# Patient Record
Sex: Female | Born: 1960 | Race: Black or African American | Hispanic: No | Marital: Married | Smoking: Never smoker
Health system: Southern US, Community
[De-identification: ages and names within clinical notes are randomized; demographics above are authoritative.]

## PROBLEM LIST (undated history)

## (undated) ENCOUNTER — Emergency Department (HOSPITAL_COMMUNITY): Discharge status: Against Medical Advice | Payer: BC Managed Care – PPO

## (undated) DIAGNOSIS — E119 Type 2 diabetes mellitus without complications: Secondary | ICD-10-CM

## (undated) DIAGNOSIS — I10 Essential (primary) hypertension: Secondary | ICD-10-CM

## (undated) HISTORY — PX: ABDOMINAL HYSTERECTOMY: SHX81

---

## 2013-06-06 ENCOUNTER — Other Ambulatory Visit (HOSPITAL_COMMUNITY): Payer: Self-pay | Admitting: "Endocrinology

## 2013-06-06 DIAGNOSIS — E0590 Thyrotoxicosis, unspecified without thyrotoxic crisis or storm: Secondary | ICD-10-CM

## 2013-06-14 ENCOUNTER — Encounter (HOSPITAL_COMMUNITY): Payer: Self-pay

## 2013-06-14 ENCOUNTER — Encounter (HOSPITAL_COMMUNITY)
Admission: RE | Admit: 2013-06-14 | Discharge: 2013-06-14 | Disposition: A | Discharge status: Home / Self Care | Payer: BC Managed Care – PPO | Source: Ambulatory Visit | Attending: "Endocrinology | Admitting: "Endocrinology

## 2013-06-14 DIAGNOSIS — E0590 Thyrotoxicosis, unspecified without thyrotoxic crisis or storm: Secondary | ICD-10-CM | POA: Insufficient documentation

## 2013-06-14 HISTORY — DX: Type 2 diabetes mellitus without complications: E11.9

## 2013-06-14 HISTORY — DX: Essential (primary) hypertension: I10

## 2013-06-14 MED ORDER — SODIUM IODIDE I 131 CAPSULE
14.0000 | Freq: Once | INTRAVENOUS | Status: AC | PRN
  Administered 2013-06-14: 14 via ORAL

## 2013-06-15 ENCOUNTER — Encounter (HOSPITAL_COMMUNITY)
Admission: RE | Admit: 2013-06-15 | Discharge: 2013-06-15 | Disposition: A | Discharge status: Home / Self Care | Payer: BC Managed Care – PPO | Source: Ambulatory Visit | Attending: "Endocrinology | Admitting: "Endocrinology

## 2013-06-15 MED ORDER — SODIUM PERTECHNETATE TC 99M INJECTION
10.0000 | Freq: Once | INTRAVENOUS | Status: AC | PRN
  Administered 2013-06-15: 10 via INTRAVENOUS

## 2013-07-09 ENCOUNTER — Other Ambulatory Visit (HOSPITAL_COMMUNITY): Payer: Self-pay | Admitting: "Endocrinology

## 2013-07-09 DIAGNOSIS — E0590 Thyrotoxicosis, unspecified without thyrotoxic crisis or storm: Secondary | ICD-10-CM

## 2013-07-13 ENCOUNTER — Encounter (HOSPITAL_COMMUNITY): Payer: BC Managed Care – PPO

## 2013-07-19 ENCOUNTER — Encounter (HOSPITAL_COMMUNITY): Payer: Self-pay

## 2013-07-19 ENCOUNTER — Encounter (HOSPITAL_COMMUNITY)
Admission: RE | Admit: 2013-07-19 | Discharge: 2013-07-19 | Disposition: A | Discharge status: Home / Self Care | Payer: BC Managed Care – PPO | Source: Ambulatory Visit | Attending: "Endocrinology | Admitting: "Endocrinology

## 2013-07-19 DIAGNOSIS — E0590 Thyrotoxicosis, unspecified without thyrotoxic crisis or storm: Secondary | ICD-10-CM

## 2013-07-19 MED ORDER — SODIUM IODIDE I 131 CAPSULE
15.0000 | Freq: Once | INTRAVENOUS | Status: AC | PRN
  Administered 2013-07-19: 15 via ORAL

## 2013-12-13 ENCOUNTER — Ambulatory Visit (INDEPENDENT_AMBULATORY_CARE_PROVIDER_SITE_OTHER): Payer: BC Managed Care – PPO | Admitting: Otolaryngology

## 2013-12-13 DIAGNOSIS — R490 Dysphonia: Secondary | ICD-10-CM

## 2013-12-13 DIAGNOSIS — R221 Localized swelling, mass and lump, neck: Secondary | ICD-10-CM

## 2013-12-17 ENCOUNTER — Other Ambulatory Visit (INDEPENDENT_AMBULATORY_CARE_PROVIDER_SITE_OTHER): Payer: Self-pay | Admitting: Otolaryngology

## 2013-12-17 DIAGNOSIS — R221 Localized swelling, mass and lump, neck: Secondary | ICD-10-CM

## 2013-12-19 ENCOUNTER — Ambulatory Visit (HOSPITAL_COMMUNITY)
Admission: RE | Admit: 2013-12-19 | Discharge: 2013-12-19 | Disposition: A | Discharge status: Home / Self Care | Payer: BC Managed Care – PPO | Source: Ambulatory Visit | Attending: Otolaryngology | Admitting: Otolaryngology

## 2013-12-19 DIAGNOSIS — R110 Nausea: Secondary | ICD-10-CM | POA: Diagnosis not present

## 2013-12-19 DIAGNOSIS — R938 Abnormal findings on diagnostic imaging of other specified body structures: Secondary | ICD-10-CM | POA: Diagnosis not present

## 2013-12-19 DIAGNOSIS — R221 Localized swelling, mass and lump, neck: Secondary | ICD-10-CM | POA: Insufficient documentation

## 2013-12-19 LAB — POCT I-STAT CREATININE: Creatinine, Ser: 0.7 mg/dL (ref 0.50–1.10)

## 2013-12-19 MED ORDER — IOHEXOL 300 MG/ML  SOLN
75.0000 mL | Freq: Once | INTRAMUSCULAR | Status: AC | PRN
  Administered 2013-12-19: 80 mL via INTRAVENOUS

## 2014-01-10 ENCOUNTER — Ambulatory Visit (INDEPENDENT_AMBULATORY_CARE_PROVIDER_SITE_OTHER): Payer: BC Managed Care – PPO | Admitting: Otolaryngology

## 2014-01-24 ENCOUNTER — Ambulatory Visit (INDEPENDENT_AMBULATORY_CARE_PROVIDER_SITE_OTHER): Payer: BC Managed Care – PPO | Admitting: Otolaryngology

## 2014-02-14 ENCOUNTER — Other Ambulatory Visit (INDEPENDENT_AMBULATORY_CARE_PROVIDER_SITE_OTHER): Payer: Self-pay | Admitting: Otolaryngology

## 2014-02-14 DIAGNOSIS — R221 Localized swelling, mass and lump, neck: Secondary | ICD-10-CM

## 2014-02-21 ENCOUNTER — Other Ambulatory Visit (INDEPENDENT_AMBULATORY_CARE_PROVIDER_SITE_OTHER): Payer: Self-pay | Admitting: Otolaryngology

## 2014-02-21 ENCOUNTER — Ambulatory Visit (HOSPITAL_COMMUNITY)
Admission: RE | Admit: 2014-02-21 | Discharge: 2014-02-21 | Disposition: A | Discharge status: Home / Self Care | Payer: BLUE CROSS/BLUE SHIELD | Source: Ambulatory Visit | Attending: Otolaryngology | Admitting: Otolaryngology

## 2014-02-21 ENCOUNTER — Encounter (HOSPITAL_COMMUNITY): Payer: Self-pay

## 2014-02-21 DIAGNOSIS — R221 Localized swelling, mass and lump, neck: Secondary | ICD-10-CM

## 2014-02-21 DIAGNOSIS — E041 Nontoxic single thyroid nodule: Secondary | ICD-10-CM | POA: Diagnosis present

## 2014-02-21 MED ORDER — LIDOCAINE HCL (PF) 2 % IJ SOLN
INTRAMUSCULAR | Status: AC
  Filled 2014-02-21: qty 10

## 2014-02-21 MED ORDER — LIDOCAINE HCL (PF) 2 % IJ SOLN
10.0000 mL | Freq: Once | INTRAMUSCULAR | Status: AC
  Administered 2014-02-21: 10 mL

## 2014-02-21 NOTE — Discharge Instructions (Signed)
Thyroid Biopsy °The thyroid gland is a butterfly-shaped gland situated in the front of the neck. It produces hormones which affect metabolism, growth and development, and body temperature. A thyroid biopsy is a procedure in which small samples of tissue or fluid are removed from the thyroid gland or mass and examined under a microscope. This test is done to determine the cause of thyroid problems, such as infection, cancer, or other thyroid problems. °There are 2 ways to obtain samples: °1. Fine needle biopsy. Samples are removed using a thin needle inserted through the skin and into the thyroid gland or mass. °2. Open biopsy. Samples are removed after a cut (incision) is made through the skin. °LET YOUR CAREGIVER KNOW ABOUT:  °· Allergies. °· Medications taken including herbs, eye drops, over-the-counter medications, and creams. °· Use of steroids (by mouth or creams). °· Previous problems with anesthetics or numbing medicine. °· Possibility of pregnancy, if this applies. °· History of blood clots (thrombophlebitis). °· History of bleeding or blood problems. °· Previous surgery. °· Other health problems. °RISKS AND COMPLICATIONS °· Bleeding from the site. The risk of bleeding is higher if you have a bleeding disorder or are taking any blood thinning medications (anticoagulants). °· Infection. °· Injury to structures near the thyroid gland. °BEFORE THE PROCEDURE  °This is a procedure that can be done as an outpatient. Confirm the time that you need to arrive for your procedure. Confirm whether there is a need to fast or withhold any medications. A blood sample may be done to determine your blood clotting time. Medicine may be given to help you relax (sedative). °PROCEDURE °Fine needle biopsy. °You will be awake during the procedure. You may be asked to lie on your back with your head tipped backward to extend your neck. Let your caregiver know if you cannot tolerate the positioning. An area on your neck will be  cleansed. A needle is inserted through the skin of your neck. You may feel a mild discomfort during this procedure. You may be asked to avoid coughing, talking, swallowing, or making sounds during some portions of the procedure. The needle is withdrawn once tissue or fluid samples have been removed. Pressure may be applied to the neck to reduce swelling and ensure that bleeding has stopped. The samples will be sent for examination.  °Open biopsy. °You will be given general anesthesia. You will be asleep during the procedure. An incision is made in your neck. A sample of thyroid tissue or the mass is removed. The tissue sample or mass will be sent for examination. The sample or mass may be examined during the biopsy. If the sample or mass contains cancer cells, some or all of the thyroid gland may be removed. The incision is closed with stitches. °AFTER THE PROCEDURE  °Your recovery will be assessed and monitored. If there are no problems, as an outpatient, you should be able to go home shortly after the procedure. °If you had a fine needle biopsy: °· You may have soreness at the biopsy site for 1 to 2 days. °If you had an open biopsy:  °· You may have soreness at the biopsy site for 3 to 4 days. °· You may have a hoarse voice or sore throat for 1 to 2 days. °Obtaining the Test Results °It is your responsibility to obtain your test results. Do not assume everything is normal if you have not heard from your caregiver or the medical facility. It is important for you to follow up   on all of your test results. °HOME CARE INSTRUCTIONS  °· Keeping your head raised on a pillow when you are lying down may ease biopsy site discomfort. °· Supporting the back of your head and neck with both hands as you sit up from a lying position may ease biopsy site discomfort. °· Only take over-the-counter or prescription medicines for pain, discomfort, or fever as directed by your caregiver. °· Throat lozenges or gargling with warm salt  water may help to soothe a sore throat. °SEEK IMMEDIATE MEDICAL CARE IF:  °· You have severe bleeding from the biopsy site. °· You have difficulty swallowing. °· You have a fever. °· You have increased pain, swelling, redness, or warmth at the biopsy site. °· You notice pus coming from the biopsy site. °· You have swollen glands (lymph nodes) in your neck. °Document Released: 11/22/2006 Document Revised: 05/22/2012 Document Reviewed: 04/19/2013 °Exit[employer]® Patient Information ©2015 [employer]. This information is not intended to replace advice given to you by your health care provider. Make sure you discuss any questions you have with your health care provider. ° °

## 2014-03-07 ENCOUNTER — Ambulatory Visit (INDEPENDENT_AMBULATORY_CARE_PROVIDER_SITE_OTHER): Payer: BLUE CROSS/BLUE SHIELD | Admitting: Otolaryngology

## 2014-03-07 ENCOUNTER — Encounter: Payer: BLUE CROSS/BLUE SHIELD | Attending: "Endocrinology | Admitting: Nutrition

## 2014-03-07 ENCOUNTER — Encounter: Payer: Self-pay | Admitting: Nutrition

## 2014-03-07 VITALS — Ht <= 58 in | Wt 108.6 lb

## 2014-03-07 DIAGNOSIS — R636 Underweight: Secondary | ICD-10-CM

## 2014-03-07 DIAGNOSIS — E108 Type 1 diabetes mellitus with unspecified complications: Principal | ICD-10-CM

## 2014-03-07 DIAGNOSIS — IMO0002 Reserved for concepts with insufficient information to code with codable children: Secondary | ICD-10-CM

## 2014-03-07 DIAGNOSIS — R1312 Dysphagia, oropharyngeal phase: Secondary | ICD-10-CM

## 2014-03-07 DIAGNOSIS — D440 Neoplasm of uncertain behavior of thyroid gland: Secondary | ICD-10-CM

## 2014-03-07 DIAGNOSIS — E1065 Type 1 diabetes mellitus with hyperglycemia: Secondary | ICD-10-CM

## 2014-03-07 NOTE — Progress Notes (Signed)
  Medical Nutrition Therapy:  Appt start time: 8127 end time:  1630.   Assessment:  Primary concerns today: Diabetes. LIve with her husband daughter and granddaugther. FBS:79  She doesn't know why her blood sugars are up and down. She thinks it has nothing to do with what she is eating. States she takes her medication as prescribed but doesn't always eat three meals per day or balanced meals. Does admit to sometimes forgetting shot at lunch. Most recent A1C was  11.8%. 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   On  lantus 20 units and 5 units of Novolog with meals.   Preferred Learning Style:     No preference indicated   Learning Readiness:     Change in progress   MEDICATIONS: see list   DIETARY INTAKE:   24-hr recall:  B ( AM): 1 slice cake, coffee and popcorn,  Snk ( AM): 1 Pepsi L ( PM): havent eaten lunch today:6" sub Snk ( PM): none D ( PM): 2 slices of pizza, poweade, coffee Snk ( PM): strawberries, water Beverages: water  Usual physical activity: ADL's  Estimated energy needs: 1600 calories 180 g carbohydrates 120 g protein 44 g fat  Progress Towards Goal(s):  In progress.   Nutritional Diagnosis:  NB-1.1 Food and nutrition-related knowledge deficit As related to Diabetes.  As evidenced by A1C 11.8%..    Intervention:  Nutrition counseling on diabetes, insulin, target ranges for blood sugars, treatment of hypo/hyperglycemia, complications, meal planning, CHO counting and importance of medication compliance..  Goals:  Follow Diabetes Meal Plan as instructed  Eat 3 meals per day.  Do not skip meals.  Cut out sweets, cakes, cookies, junk food and sodas  Avoid snacks between meals unless a low blood sugar  Be sure to take Novolog as prescribed before meals and use the sliding scale correctly.  Limit carbohydrate intake to 45 grams carbohydrate/meal  Add lean protein foods to meals/snacks  Monitor glucose levels as instructed by your  doctor  Aim for 30 mins of physical activity daily  Bring food record and glucose log to your next nutrition visit  Goal: 1. Get A1C down to 9% in three months. 2. Take medications as prescribed.   Teaching Method Utilized:  Visual Auditory Hands on  Handouts given during visit include: The Plate Method Carb Counting and Food Label handouts Meal Plan Card  Barriers to learning/adherence to lifestyle change: none  Demonstrated degree of understanding via:  Teach Back   Monitoring/Evaluation:  Dietary intake, exercise, meal planning, SBG, and body weight in 1 month(s).

## 2014-03-08 DIAGNOSIS — E1065 Type 1 diabetes mellitus with hyperglycemia: Secondary | ICD-10-CM | POA: Insufficient documentation

## 2014-03-08 DIAGNOSIS — E108 Type 1 diabetes mellitus with unspecified complications: Principal | ICD-10-CM

## 2014-03-08 DIAGNOSIS — IMO0002 Reserved for concepts with insufficient information to code with codable children: Secondary | ICD-10-CM | POA: Insufficient documentation

## 2014-03-08 NOTE — Patient Instructions (Signed)
Goals:  Follow Diabetes Meal Plan as instructed  Eat 3 meals per day.  Do not skip meals.  Cut out sweets, cakes, cookies, junk food and sodas  Avoid snacks between meals unless a low blood sugar  Be sure to take Novolog as prescribed before meals and use the sliding scale correctly.  Limit carbohydrate intake to 45 grams carbohydrate/meal  Add lean protein foods to meals/snacks  Monitor glucose levels as instructed by your doctor  Aim for 30 mins of physical activity daily  Bring food record and glucose log to your next nutrition visit  Goal: 1. Get A1C down to 9% in three months. 2. Take medications as prescribed.

## 2014-04-17 ENCOUNTER — Encounter: Payer: BLUE CROSS/BLUE SHIELD | Attending: "Endocrinology | Admitting: Nutrition

## 2014-12-09 ENCOUNTER — Ambulatory Visit: Payer: Self-pay | Admitting: "Endocrinology

## 2015-02-12 ENCOUNTER — Other Ambulatory Visit (INDEPENDENT_AMBULATORY_CARE_PROVIDER_SITE_OTHER): Payer: Self-pay | Admitting: Otolaryngology

## 2015-02-12 DIAGNOSIS — E041 Nontoxic single thyroid nodule: Secondary | ICD-10-CM

## 2015-02-21 ENCOUNTER — Ambulatory Visit (HOSPITAL_COMMUNITY)
Admission: RE | Admit: 2015-02-21 | Discharge: 2015-02-21 | Disposition: A | Discharge status: Home / Self Care | Payer: BLUE CROSS/BLUE SHIELD | Source: Ambulatory Visit | Attending: Otolaryngology | Admitting: Otolaryngology

## 2015-02-21 ENCOUNTER — Other Ambulatory Visit (HOSPITAL_COMMUNITY): Payer: BLUE CROSS/BLUE SHIELD

## 2015-02-21 DIAGNOSIS — E041 Nontoxic single thyroid nodule: Secondary | ICD-10-CM | POA: Diagnosis present

## 2015-02-21 DIAGNOSIS — E042 Nontoxic multinodular goiter: Secondary | ICD-10-CM | POA: Diagnosis not present

## 2015-02-27 ENCOUNTER — Ambulatory Visit (INDEPENDENT_AMBULATORY_CARE_PROVIDER_SITE_OTHER): Payer: BLUE CROSS/BLUE SHIELD | Admitting: Otolaryngology

## 2015-02-27 DIAGNOSIS — D440 Neoplasm of uncertain behavior of thyroid gland: Secondary | ICD-10-CM

## 2015-02-27 DIAGNOSIS — R1312 Dysphagia, oropharyngeal phase: Secondary | ICD-10-CM

## 2015-04-07 ENCOUNTER — Other Ambulatory Visit: Payer: Self-pay | Admitting: Otolaryngology

## 2015-04-21 ENCOUNTER — Other Ambulatory Visit: Payer: Self-pay | Admitting: "Endocrinology

## 2015-05-07 ENCOUNTER — Other Ambulatory Visit: Payer: Self-pay | Admitting: "Endocrinology

## 2015-05-27 ENCOUNTER — Encounter (HOSPITAL_COMMUNITY)
Admission: RE | Admit: 2015-05-27 | Discharge: 2015-05-27 | Disposition: A | Discharge status: Home / Self Care | Payer: BLUE CROSS/BLUE SHIELD | Source: Ambulatory Visit | Attending: Otolaryngology | Admitting: Otolaryngology

## 2015-05-27 DIAGNOSIS — E119 Type 2 diabetes mellitus without complications: Secondary | ICD-10-CM | POA: Diagnosis not present

## 2015-05-27 DIAGNOSIS — Z01812 Encounter for preprocedural laboratory examination: Secondary | ICD-10-CM | POA: Insufficient documentation

## 2015-05-27 DIAGNOSIS — Z01818 Encounter for other preprocedural examination: Secondary | ICD-10-CM | POA: Diagnosis not present

## 2015-05-27 DIAGNOSIS — Z7982 Long term (current) use of aspirin: Secondary | ICD-10-CM | POA: Diagnosis not present

## 2015-05-27 DIAGNOSIS — I10 Essential (primary) hypertension: Secondary | ICD-10-CM | POA: Insufficient documentation

## 2015-05-27 DIAGNOSIS — Z794 Long term (current) use of insulin: Secondary | ICD-10-CM | POA: Insufficient documentation

## 2015-05-27 DIAGNOSIS — Z79899 Other long term (current) drug therapy: Secondary | ICD-10-CM | POA: Insufficient documentation

## 2015-05-27 LAB — CBC
HEMATOCRIT: 35.2 % — AB (ref 36.0–46.0)
Hemoglobin: 11.2 g/dL — ABNORMAL LOW (ref 12.0–15.0)
MCH: 24.9 pg — ABNORMAL LOW (ref 26.0–34.0)
MCHC: 31.8 g/dL (ref 30.0–36.0)
MCV: 78.4 fL (ref 78.0–100.0)
Platelets: 282 10*3/uL (ref 150–400)
RBC: 4.49 MIL/uL (ref 3.87–5.11)
RDW: 13.4 % (ref 11.5–15.5)
WBC: 7.6 10*3/uL (ref 4.0–10.5)

## 2015-05-27 LAB — BASIC METABOLIC PANEL
ANION GAP: 11 (ref 5–15)
BUN: 12 mg/dL (ref 6–20)
CHLORIDE: 105 mmol/L (ref 101–111)
CO2: 28 mmol/L (ref 22–32)
Calcium: 10.1 mg/dL (ref 8.9–10.3)
Creatinine, Ser: 0.99 mg/dL (ref 0.44–1.00)
GFR calc non Af Amer: >60 mL/min (ref >60)
Glucose, Bld: 272 mg/dL — ABNORMAL HIGH (ref 65–99)
POTASSIUM: 3.8 mmol/L (ref 3.5–5.1)
Sodium: 144 mmol/L (ref 135–145)

## 2015-05-27 LAB — GLUCOSE, CAPILLARY: GLUCOSE-CAPILLARY: 223 mg/dL — AB (ref 65–99)

## 2015-05-27 NOTE — Pre-Procedure Instructions (Signed)
Rebecca Chan  05/27/2015      [id number] [place] - [id number] [place] [address] Phone: [phone] Fax: [phone]    Your procedure is scheduled on June 04, 2015.  Report to [facility] Admitting at 6:30 A.M.  Call this number if you have problems the morning of surgery:  [phone]   Remember:  Do not eat food or drink liquids after midnight.  Take these medicines the morning of surgery with A SIP OF WATER : NONE  STOP ASPRIN, HERBAL MEDICATIONS, NSAID'S(ALEVE, ADVIL, IBUPROFEN) ONE WEEK PRIOR TO SURGERY   How to Manage Your Diabetes Before and After Surgery  Why is it important to control my blood sugar before and after surgery? . Improving blood sugar levels before and after surgery helps healing and can limit problems. . A way of improving blood sugar control is eating a healthy diet by: o  Eating less sugar and carbohydrates o  Increasing activity/exercise o  Talking with your doctor about reaching your blood sugar goals . High blood sugars (greater than 180 mg/dL) can raise your risk of infections and slow your recovery, so you will need to focus on controlling your diabetes during the weeks before surgery. . Make sure that the doctor who takes care of your diabetes knows about your planned surgery including the date and location.  How do I manage my blood sugar before surgery? . Check your blood sugar at least 4 times a day, starting 2 days before surgery, to make sure that the level is not too high or low. o Check your blood sugar the morning of your surgery when you wake up and every 2 hours until you get to the Short Stay unit. . If your blood sugar is less than 70 mg/dL, you will need to treat for low blood sugar: o Do not take insulin. o Treat a low blood sugar (less than 70 mg/dL) with  cup of clear juice (cranberry or apple), 4 glucose tablets, OR glucose  gel. o Recheck blood sugar in 15 minutes after treatment (to make sure it is greater than 70 mg/dL). If your blood sugar is not greater than 70 mg/dL on recheck, call [phone] for further instructions. . Report your blood sugar to the short stay nurse when you get to Short Stay.  . If you are admitted to the hospital after surgery: o Your blood sugar will be checked by the staff and you will probably be given insulin after surgery (instead of oral diabetes medicines) to make sure you have good blood sugar levels. o The goal for blood sugar control after surgery is 80-180 mg/dL.       WHAT DO I DO ABOUT MY DIABETES MEDICATION?   Marland Kitchen Do not take oral diabetes medicines (pills) the morning of surgery.  . THE NIGHT BEFORE SURGERY, take ___15___ units of ___LANTUS____insulin.      . The day of surgery, do not take other diabetes injectables, including Byetta (exenatide), Bydureon (exenatide ER), Victoza (liraglutide), or Trulicity (dulaglutide).  . If your CBG is greater than 220 mg/dL the morning of your surgery, you may take  of your HUMALOG dose of insulin.    Do not wear jewelry, make-up or nail polish.  Do not wear lotions, powders, or perfumes.  You may wear deodorant.  Do not shave 48 hours prior to surgery.  Men may shave face and neck.  Do not bring valuables to the hospital.  [facility] is not responsible for any belongings or valuables.  Contacts, dentures or bridgework may not be worn into surgery.  Leave your suitcase in the car.  After surgery it may be brought to your room.  For patients admitted to the hospital, discharge time will be determined by your treatment team.  Patients discharged the day of surgery will not be allowed to drive home.   Name and phone number of your driver:    Special instructions:  "preparing for surgery"  Please read over the following fact sheets that you were given. Pain Booklet, Coughing and Deep Breathing and Surgical Site  Infection Prevention

## 2015-05-28 ENCOUNTER — Encounter (HOSPITAL_COMMUNITY): Payer: Self-pay | Admitting: Emergency Medicine

## 2015-05-28 LAB — HEMOGLOBIN A1C
Hgb A1c MFr Bld: 12.8 % — ABNORMAL HIGH (ref 4.8–5.6)
Mean Plasma Glucose: 321 mg/dL

## 2015-05-28 NOTE — Progress Notes (Addendum)
Anesthesia Chart Review:  Pt is a 55 year old female scheduled for R hemi thyroidectomy on 06/04/2015 with Dr. Benjamine Mola.   PMH includes:  HTN, DM. Never smoker. BMI 20  BP at PAT was 190/90  Medications include: ASA, lantus, humalog, lisinopril  Preoperative labs reviewed.  HgbA1c is 12.8 which is consistent with an average blood glucose of 321. Glucose was 272  CXR has been requested. Pt reports she has had one within the month.   EKG has been requested.   Willeen Cass, FNP-BC [facility] Short Stay Surgical Center/Anesthesiology Phone: [phone] 05/28/2015 12:12 PM  Addendum: Patient called. She reported that she had not taken lisinopril prior to getting her vitals at PAT. She thinks her BP two weeks ago was around 169/82. In regards to her DM, she admits that her blood sugars have been poorly controlled. Fasting is typically > 200 (was 236 this morning). She is taking Lantus 30 Units at bedtime, Victoza 1.6 mg SQ daily (not listed on medication list; was given a 30 day sample by practitioner Edd Fabian, NP at [facility] in [place]), and taking a Humalog SSI TID with meals (roughly: 5 Units for CBG 200, plus an additional unit for every 10 mg/dl above 200; ie she took 8 units for CBG 236 this morning). She had been seeing endocrinologist Dr. Myles Gip in [place] until his retirement. She has since seen Dr. Dorris Fetch in [place], but not since 09/2014. She later saw Edd Fabian who advised that patient continue to follow up with endocrinology. Following her PAT visit with perioperative DM teaching yesterday, she called Dr. Liliane Channel office to attempt to get in to see him prior to surgery in hopes to get her DM better controlled. I also faxed him her labs for review. Spark Malta from his office called and ased on her elevated A1c, he would not clear her from an endocrinology standpoint and recommended surgery be postponed until he could re-evaluate and get her glucose better controlled. Discussed  with anesthesiologist Dr. Deatra Canter. Dr. Benjamine Mola will need to be notified of BP and A1c results. If surgery is not urgent, then would recommend postponing surgery until both are better controlled. If Dr. Benjamine Mola wishes to proceed as planned then she is at high risk to be canceled on the day of surgery. Of note, I did discuss with patient that Dr. Benjamine Mola would have to decide if he felt her surgery should be postponed but even if he wished to proceed that she may get canceled if her BP was significantly elevated or if her glucose was over 200.   We are still awaiting EKG. Levada Dy or I will attempt to get EKG tracing and follow-up with Dr. Benjamine Mola tomorrow to review results.  [facility] Short Stay Center/Anesthesiology Phone [phone] 05/28/2015 4:46 PM  Addendum: I called [place]. She has only been seen there once. There is no EKG or CXR done there. I did send a request to [facility] to see if they have these available (reponse pending). I also called and spoke with Timmi at Dr. Deeann Saint office regarding elevated BP and uncontrolled DM and that Dr. Dorris Fetch advised that he would not clear patient from an endocrinology standpoint. We discussed recommendations to postpone surgery if case is not urgent (otherwise a high risk for cancellation), or if case more of an urgent nature consider admitting patient prior to surgery for DM control or contacting her PCP for further DM management recommendations.  Myra Gianotti, PA-C [facility] Short Stay Center/Anesthesiology  Phone [phone] 05/29/2015 9:51 AM

## 2015-06-02 ENCOUNTER — Other Ambulatory Visit: Payer: Self-pay | Admitting: "Endocrinology

## 2015-06-02 NOTE — Telephone Encounter (Signed)
Pt did labs. Has not been seen since 09-30-14. Has not made appt yet. Requesting refill of Lantus?

## 2015-06-02 NOTE — Telephone Encounter (Signed)
She has to return for a visit before a refill.

## 2015-06-04 ENCOUNTER — Ambulatory Visit: Payer: Self-pay | Admitting: "Endocrinology

## 2015-06-04 ENCOUNTER — Encounter: Payer: Self-pay | Admitting: "Endocrinology

## 2015-06-04 ENCOUNTER — Ambulatory Visit (INDEPENDENT_AMBULATORY_CARE_PROVIDER_SITE_OTHER): Payer: BLUE CROSS/BLUE SHIELD | Admitting: "Endocrinology

## 2015-06-04 ENCOUNTER — Encounter (HOSPITAL_COMMUNITY): Admission: RE | Payer: Self-pay | Source: Ambulatory Visit

## 2015-06-04 ENCOUNTER — Ambulatory Visit (HOSPITAL_COMMUNITY): Admission: RE | Admit: 2015-06-04 | Payer: BLUE CROSS/BLUE SHIELD | Source: Ambulatory Visit | Admitting: Otolaryngology

## 2015-06-04 VITALS — BP 145/66 | HR 98 | Ht 59.0 in | Wt 99.0 lb

## 2015-06-04 DIAGNOSIS — E108 Type 1 diabetes mellitus with unspecified complications: Secondary | ICD-10-CM

## 2015-06-04 DIAGNOSIS — E042 Nontoxic multinodular goiter: Secondary | ICD-10-CM | POA: Diagnosis not present

## 2015-06-04 DIAGNOSIS — Z91199 Patient's noncompliance with other medical treatment and regimen due to unspecified reason: Secondary | ICD-10-CM | POA: Insufficient documentation

## 2015-06-04 DIAGNOSIS — E785 Hyperlipidemia, unspecified: Secondary | ICD-10-CM | POA: Insufficient documentation

## 2015-06-04 DIAGNOSIS — I10 Essential (primary) hypertension: Secondary | ICD-10-CM | POA: Diagnosis not present

## 2015-06-04 DIAGNOSIS — Z9119 Patient's noncompliance with other medical treatment and regimen: Secondary | ICD-10-CM | POA: Diagnosis not present

## 2015-06-04 DIAGNOSIS — IMO0002 Reserved for concepts with insufficient information to code with codable children: Secondary | ICD-10-CM

## 2015-06-04 DIAGNOSIS — E1065 Type 1 diabetes mellitus with hyperglycemia: Secondary | ICD-10-CM

## 2015-06-04 SURGERY — THYROIDECTOMY
Anesthesia: General | Laterality: Right

## 2015-06-04 NOTE — Progress Notes (Signed)
Subjective:    Patient ID: Rebecca Chan, female    DOB: 29-May-1960, PCP Inc [facility]   Past Medical History  Diagnosis Date  . Hypertension   . Diabetes mellitus without complication [facility])    Past Surgical History  Procedure Laterality Date  . Abdominal hysterectomy     Social History   Social History  . Marital Status: Married    Spouse Name: N/A  . Number of Children: N/A  . Years of Education: N/A   Social History Main Topics  . Smoking status: Never Smoker   . Smokeless tobacco: Never Used  . Alcohol Use: None  . Drug Use: None  . Sexual Activity: Not Asked   Other Topics Concern  . None   Social History Narrative   Outpatient Encounter Prescriptions as of 06/04/2015  Medication Sig  . Insulin Glargine (LANTUS SOLOSTAR) 100 UNIT/ML Solostar Pen Inject 20 Units into the skin at bedtime.  . Insulin Lispro (HUMALOG KWIKPEN) 200 UNIT/ML SOPN Inject 5-11 Units into the skin 3 (three) times daily.  Marland Kitchen lisinopril (PRINIVIL,ZESTRIL) 40 MG tablet Take 40 mg by mouth daily.  . [DISCONTINUED] aspirin EC 81 MG tablet Take 81 mg by mouth daily.  . [DISCONTINUED] cholecalciferol (VITAMIN D) 1000 units tablet Take 1,000 Units by mouth daily.  . [DISCONTINUED] insulin glargine (LANTUS) 100 UNIT/ML injection Inject 30 Units into the skin at bedtime.  . [DISCONTINUED] insulin lispro (HUMALOG) 100 UNIT/ML injection Inject 10 Units into the skin 3 (three) times daily with meals.   No facility-administered encounter medications on file as of 06/04/2015.   ALLERGIES: No Known Allergies VACCINATION STATUS:  There is no immunization history on file for this patient.  Diabetes She presents for her follow-up diabetic visit. She has type 1 diabetes mellitus. Onset time: She was diagnosed at approximate age of 46 years. Her disease course has been worsening. There are no hypoglycemic associated symptoms. Pertinent negatives for hypoglycemia include no  confusion, headaches, pallor or seizures. Associated symptoms include fatigue, polydipsia and polyuria. Pertinent negatives for diabetes include no chest pain and no polyphagia. There are no hypoglycemic complications. Symptoms are worsening. There are no diabetic complications. Risk factors for coronary artery disease include dyslipidemia, diabetes mellitus and hypertension. She is compliant with treatment none of the time. Her weight is decreasing steadily. She is following a generally unhealthy diet. When asked about meal planning, she reported none. She has not had a previous visit with a dietitian (She missed several of her appointments with a dietitian.). She never participates in exercise. Home blood sugar record trend: She brought an incomplete log showing. Her random monitoring of blood glucose ranging from 100-420. Her overall blood glucose range is >200 mg/dl. An ACE inhibitor/angiotensin II receptor blocker is being taken.  Hypertension This is a chronic problem. The current episode started more than 1 year ago. The problem is uncontrolled. Pertinent negatives include no chest pain, headaches, palpitations or shortness of breath. Risk factors for coronary artery disease include dyslipidemia and diabetes mellitus. Past treatments include ACE inhibitors. Compliance problems include psychosocial issues.      Review of Systems  Constitutional: Positive for fatigue. Negative for fever, chills and unexpected weight change.  HENT: Negative for trouble swallowing and voice change.   Eyes: Negative for visual disturbance.  Respiratory: Negative for cough, shortness of breath and wheezing.   Cardiovascular: Negative for chest pain, palpitations and leg swelling.  Gastrointestinal: Negative for nausea, vomiting and diarrhea.  Endocrine:  Positive for polydipsia and polyuria. Negative for cold intolerance, heat intolerance and polyphagia.  Musculoskeletal: Negative for myalgias and arthralgias.  Skin:  Negative for color change, pallor, rash and wound.  Neurological: Negative for seizures and headaches.  Psychiatric/Behavioral: Negative for suicidal ideas and confusion.    Objective:    BP 145/66 mmHg  Pulse 98  Ht 4\' 11"  (1.499 m)  Wt 99 lb (44.906 kg)  BMI 19.98 kg/m2  SpO2 96%  Wt Readings from Last 3 Encounters:  06/04/15 99 lb (44.906 kg)  05/27/15 97 lb 11.2 oz (44.316 kg)  03/07/14 108 lb 9.6 oz (49.261 kg)    Physical Exam  Constitutional: She is oriented to person, place, and time. She appears well-developed.  HENT:  Head: Normocephalic and atraumatic.  Eyes: EOM are normal.  Neck: Normal range of motion. Neck supple. No tracheal deviation present. No thyromegaly present.  Cardiovascular: Normal rate and regular rhythm.   Pulmonary/Chest: Effort normal and breath sounds normal.  Abdominal: Soft. Bowel sounds are normal. There is no tenderness. There is no guarding.  Musculoskeletal: Normal range of motion. She exhibits no edema.  Neurological: She is alert and oriented to person, place, and time. She has normal reflexes. No cranial nerve deficit. Coordination normal.  Skin: Skin is warm and dry. No rash noted. No erythema. No pallor.  Psychiatric:  Reluctant affect, unconcerned attitude.      CMP ( most recent) CMP     Component Value Date/Time   NA 144 05/27/2015 1551   K 3.8 05/27/2015 1551   CL 105 05/27/2015 1551   CO2 28 05/27/2015 1551   GLUCOSE 272* 05/27/2015 1551   BUN 12 05/27/2015 1551   CREATININE 0.99 05/27/2015 1551   CALCIUM 10.1 05/27/2015 1551   GFRNONAA >60 05/27/2015 1551   GFRAA >60 05/27/2015 1551     Diabetic Labs (most recent): Lab Results  Component Value Date   HGBA1C 12.8* 05/27/2015    Assessment & Plan:   1. Type I diabetes mellitus with complication, uncontrolled ([place]) -Her diabetes is  complicated by noncompliance and nonadherence and patient remains at a high risk for more acute and chronic complications of  diabetes which include CAD, CVA, CKD, retinopathy, and neuropathy. These are all discussed in detail with the patient.  Patient came with inadequate glucose profile, missed her last appointments since August 2016, and  recent A1c of 12.8% did not improve from her last A1c of 13 %.    Recent labs reviewed.  -Unfortunately, patient remains alarmingly noncompliant and unconcerned. - I have re-counseled the patient on the need to use insulin strictly by the schedule. - Suggestion is made for patient to avoid simple carbohydrates   from their diet including Cakes , Desserts, Ice Cream,  Soda (  diet and regular) , Sweet Tea , Candies,  Chips, Cookies, Artificial Sweeteners,   and "Sugar-free" Products .  This will help patient to have stable blood glucose profile and potentially avoid unintended  Weight gain.  - Patient is advised to stick to a routine mealtimes to eat 3 meals  a day and avoid unnecessary snacks ( to snack only to correct hypoglycemia).  - I have approached patient with the following individualized plan to manage diabetes and patient reluctantly agrees.  Patient presentation is not typical for type 2 nor type 1, but may be LADA or Monogenic Diabetes.  She has normal renal function.  - I advised her to resume Lantus at 20 units qhs, and Novolog 6  units TIDAC for pre-meal BG readings of 90-150mg /dl, plus patient specific correction dose of rapid acting insulin for unexpected hyperglycemia above 150mg /dl, associated with strict monitoring of BG AC and HS.  -Adjustment parameters for hypo and hyperglycemia were given in a written document to patient. -Patient is encouraged to call clinic for blood glucose levels less than 70 or above 300 mg /dl. -Once her a1c approaches target, I will study her pancreatic reserve to characterize her diabetes better.  -She is not a candidate for metformin,SGLT2 inhibitors, and incretin therapy . - Patient specific target  for A1c; LDL, HDL,  Triglycerides, and  Waist Circumference were discussed in detail.  2) BP/HTN:  uncontrolled. Continue current medications including ACEI/ARB. 3) Lipids/HPL:  Lipid panel unknown for now. She will be considered for low-dose  Statins on subsequent visits.  4)  Weight/Diet:  exercise, and carbohydrates information provided. 5. Personal history of noncompliance with medical treatment, presenting hazards to health -I have counseled her for better engagement for intensive insulin therapy to minimize her risk of complications.  6) multinodular goiter: -Per her report, she was supposed to have thyroidectomy this morning which was canceled due to severe hyperglycemia. Review of her thyroid studies show that there are more than 1 slowly growing thyroid nodules on bilateral thyroid lobe, benign  fine-needle aspiration in 2016. -I discussed with her the need for better control of diabetes to minimize surgical complications. -She will be assessed for possible surgical clearance in 2 weeks time based on her glycemic response.  7) Chronic Care/Health Maintenance:  -Patient is on ACEI/ARB  medications and encouraged to continue to follow up with Ophthalmology, Podiatrist at least yearly or according to recommendations, and advised to  stay away from smoking. I have recommended yearly flu vaccine and pneumonia vaccination at least every 5 years; moderate intensity exercise for up to 150 minutes weekly; and  sleep for at least 7 hours a day.  - 25 minutes of time was spent on the care of this patient , 50% of which was applied for counseling on diabetes complications and their preventions.  - I advised patient to maintain close follow up with [facility] for primary care needs.  Patient is asked to bring meter and  blood glucose logs during their next visit.   Follow up plan: -Return in about 2 weeks (around 06/18/2015) for follow up with meter and logs- no labs.  Glade Lloyd,  MD Phone: [phone]  Fax: [phone]   06/04/2015, 2:57 PM

## 2015-06-23 ENCOUNTER — Encounter: Payer: Self-pay | Admitting: "Endocrinology

## 2015-06-23 ENCOUNTER — Ambulatory Visit (INDEPENDENT_AMBULATORY_CARE_PROVIDER_SITE_OTHER): Payer: BLUE CROSS/BLUE SHIELD | Admitting: "Endocrinology

## 2015-06-23 VITALS — BP 139/66 | HR 101 | Ht 59.0 in | Wt 108.0 lb

## 2015-06-23 DIAGNOSIS — E108 Type 1 diabetes mellitus with unspecified complications: Secondary | ICD-10-CM

## 2015-06-23 DIAGNOSIS — I10 Essential (primary) hypertension: Secondary | ICD-10-CM | POA: Diagnosis not present

## 2015-06-23 DIAGNOSIS — Z91199 Patient's noncompliance with other medical treatment and regimen due to unspecified reason: Secondary | ICD-10-CM

## 2015-06-23 DIAGNOSIS — IMO0002 Reserved for concepts with insufficient information to code with codable children: Secondary | ICD-10-CM

## 2015-06-23 DIAGNOSIS — E1065 Type 1 diabetes mellitus with hyperglycemia: Secondary | ICD-10-CM | POA: Diagnosis not present

## 2015-06-23 DIAGNOSIS — Z9119 Patient's noncompliance with other medical treatment and regimen: Secondary | ICD-10-CM | POA: Diagnosis not present

## 2015-06-23 DIAGNOSIS — E042 Nontoxic multinodular goiter: Secondary | ICD-10-CM

## 2015-06-23 NOTE — Progress Notes (Signed)
Subjective:    Patient ID: Rebecca Chan, female    DOB: 01-Aug-1960, PCP Inc [facility]   Past Medical History  Diagnosis Date  . Hypertension   . Diabetes mellitus without complication [facility])    Past Surgical History  Procedure Laterality Date  . Abdominal hysterectomy     Social History   Social History  . Marital Status: Married    Spouse Name: N/A  . Number of Children: N/A  . Years of Education: N/A   Social History Main Topics  . Smoking status: Never Smoker   . Smokeless tobacco: Never Used  . Alcohol Use: None  . Drug Use: None  . Sexual Activity: Not Asked   Other Topics Concern  . None   Social History Narrative   Outpatient Encounter Prescriptions as of 06/23/2015  Medication Sig  . Insulin Glargine (LANTUS SOLOSTAR) 100 UNIT/ML Solostar Pen Inject 20 Units into the skin at bedtime.  . Insulin Lispro (HUMALOG KWIKPEN) 200 UNIT/ML SOPN Inject 5-11 Units into the skin 3 (three) times daily.  Marland Kitchen lisinopril (PRINIVIL,ZESTRIL) 40 MG tablet Take 40 mg by mouth daily.   No facility-administered encounter medications on file as of 06/23/2015.   ALLERGIES: No Known Allergies VACCINATION STATUS:  There is no immunization history on file for this patient.  Diabetes She presents for her follow-up diabetic visit. She has type 1 diabetes mellitus. Onset time: She was diagnosed at approximate age of 48 years. Her disease course has been worsening. There are no hypoglycemic associated symptoms. Pertinent negatives for hypoglycemia include no confusion, headaches, pallor or seizures. Associated symptoms include fatigue, polydipsia and polyuria. Pertinent negatives for diabetes include no chest pain and no polyphagia. There are no hypoglycemic complications. Symptoms are worsening. There are no diabetic complications. Risk factors for coronary artery disease include dyslipidemia, diabetes mellitus and hypertension. She is compliant with treatment  none of the time. Her weight is decreasing steadily. She is following a generally unhealthy diet. When asked about meal planning, she reported none. She has not had a previous visit with a dietitian (She missed several of her appointments with a dietitian.). She never participates in exercise. Home blood sugar record trend: She did not bring her log, her meter shows random blood glucose reading which is very rare and showing significantly above target average at 243. Her overall blood glucose range is >200 mg/dl. An ACE inhibitor/angiotensin II receptor blocker is being taken.  Hypertension This is a chronic problem. The current episode started more than 1 year ago. The problem is uncontrolled. Pertinent negatives include no chest pain, headaches, palpitations or shortness of breath. Risk factors for coronary artery disease include dyslipidemia and diabetes mellitus. Past treatments include ACE inhibitors. Compliance problems include psychosocial issues.      Review of Systems  Constitutional: Positive for fatigue. Negative for fever, chills and unexpected weight change.  HENT: Negative for trouble swallowing and voice change.   Eyes: Negative for visual disturbance.  Respiratory: Negative for cough, shortness of breath and wheezing.   Cardiovascular: Negative for chest pain, palpitations and leg swelling.  Gastrointestinal: Negative for nausea, vomiting and diarrhea.  Endocrine: Positive for polydipsia and polyuria. Negative for cold intolerance, heat intolerance and polyphagia.  Musculoskeletal: Negative for myalgias and arthralgias.  Skin: Negative for color change, pallor, rash and wound.  Neurological: Negative for seizures and headaches.  Psychiatric/Behavioral: Negative for suicidal ideas and confusion.    Objective:    BP 139/66 mmHg  Pulse  101  Ht 4\' 11"  (1.499 m)  Wt 108 lb (48.988 kg)  BMI 21.80 kg/m2  SpO2 100%  Wt Readings from Last 3 Encounters:  06/23/15 108 lb (48.988 kg)   06/04/15 99 lb (44.906 kg)  05/27/15 97 lb 11.2 oz (44.316 kg)    Physical Exam  Constitutional: She is oriented to person, place, and time. She appears well-developed.  HENT:  Head: Normocephalic and atraumatic.  Eyes: EOM are normal.  Neck: Normal range of motion. Neck supple. No tracheal deviation present. No thyromegaly present.  Cardiovascular: Normal rate and regular rhythm.   Pulmonary/Chest: Effort normal and breath sounds normal.  Abdominal: Soft. Bowel sounds are normal. There is no tenderness. There is no guarding.  Musculoskeletal: Normal range of motion. She exhibits no edema.  Neurological: She is alert and oriented to person, place, and time. She has normal reflexes. No cranial nerve deficit. Coordination normal.  Skin: Skin is warm and dry. No rash noted. No erythema. No pallor.  Psychiatric:  Reluctant affect, unconcerned attitude.      CMP ( most recent) CMP     Component Value Date/Time   NA 144 05/27/2015 1551   K 3.8 05/27/2015 1551   CL 105 05/27/2015 1551   CO2 28 05/27/2015 1551   GLUCOSE 272* 05/27/2015 1551   BUN 12 05/27/2015 1551   CREATININE 0.99 05/27/2015 1551   CALCIUM 10.1 05/27/2015 1551   GFRNONAA >60 05/27/2015 1551   GFRAA >60 05/27/2015 1551     Diabetic Labs (most recent): Lab Results  Component Value Date   HGBA1C 12.8* 05/27/2015    Assessment & Plan:   1. Type I diabetes mellitus with complication, uncontrolled ([place]) -Her diabetes is  complicated by noncompliance and nonadherence and patient remains at a high risk for more acute and chronic complications of diabetes which include CAD, CVA, CKD, retinopathy, and neuropathy. These are all discussed in detail with the patient.  Patient came with inadequate glucose profile, missed her last appointments since August 2016, and  recent A1c of 12.8% did not improve from her last A1c of 13 %.    Recent labs reviewed.  -Unfortunately, patient remains alarmingly noncompliant and  unconcerned. - I have re-counseled the patient on the need to use insulin strictly by the schedule. - Suggestion is made for patient to avoid simple carbohydrates   from their diet including Cakes , Desserts, Ice Cream,  Soda (  diet and regular) , Sweet Tea , Candies,  Chips, Cookies, Artificial Sweeteners,   and "Sugar-free" Products .  This will help patient to have stable blood glucose profile and potentially avoid unintended  Weight gain.  - Patient is advised to stick to a routine mealtimes to eat 3 meals  a day and avoid unnecessary snacks ( to snack only to correct hypoglycemia).  - I have approached patient with the following individualized plan to manage diabetes and patient reluctantly agrees.  Patient presentation is not typical for type 2 nor type 1, but may be LADA or Monogenic Diabetes.  She has normal renal function.  - I advised her to resume Lantus at 20 units qhs, and Novolog 6 units TIDAC for pre-meal BG readings of 90-150mg /dl, plus patient specific correction dose of rapid acting insulin for unexpected hyperglycemia above 150mg /dl, associated with strict monitoring of BG AC and HS.  -Adjustment parameters for hypo and hyperglycemia were given in a written document to patient. -Patient is encouraged to call clinic for blood glucose levels less than 70  or above 300 mg /dl. -Once her a1c approaches target, I will study her pancreatic reserve to characterize her diabetes better.  -She is not a candidate for metformin,SGLT2 inhibitors, and incretin therapy . - Patient specific target  for A1c; LDL, HDL, Triglycerides, and  Waist Circumference were discussed in detail.  2) BP/HTN:  uncontrolled. Continue current medications including ACEI/ARB. 3) Lipids/HPL:  Lipid panel unknown for now. She will be considered for low-dose  Statins on subsequent visits.  4)  Weight/Diet:  exercise, and carbohydrates information provided. 5. Personal history of noncompliance with medical  treatment, presenting hazards to health -I have counseled her for better engagement for intensive insulin therapy to minimize her risk of complications.  6) multinodular goiter: -Per her report, she was supposed to have thyroidectomy this morning which was canceled due to severe hyperglycemia. Review of her thyroid studies show that there are more than 1 slowly growing thyroid nodules on bilateral thyroid lobe, benign  fine-needle aspiration in 2016. -I discussed with her the need for better control of diabetes to minimize surgical complications. -She will be assessed for possible surgical clearance in 4 weeks time based on her glycemic response.  7) Chronic Care/Health Maintenance:  -Patient is on ACEI/ARB  medications and encouraged to continue to follow up with Ophthalmology, Podiatrist at least yearly or according to recommendations, and advised to  stay away from smoking. I have recommended yearly flu vaccine and pneumonia vaccination at least every 5 years; moderate intensity exercise for up to 150 minutes weekly; and  sleep for at least 7 hours a day.  - 25 minutes of time was spent on the care of this patient , 50% of which was applied for counseling on diabetes complications and their preventions.  - I advised patient to maintain close follow up with [facility] for primary care needs.  Patient is asked to bring meter and  blood glucose logs during their next visit.   Follow up plan: -Return in about 4 weeks (around 07/21/2015) for diabetes, follow up with meter and logs- no labs.  Glade Lloyd, MD Phone: [phone]  Fax: [phone]   06/23/2015, 4:56 PM

## 2015-07-03 ENCOUNTER — Ambulatory Visit (INDEPENDENT_AMBULATORY_CARE_PROVIDER_SITE_OTHER): Payer: BLUE CROSS/BLUE SHIELD | Admitting: Otolaryngology

## 2015-07-03 DIAGNOSIS — D440 Neoplasm of uncertain behavior of thyroid gland: Secondary | ICD-10-CM

## 2015-07-21 ENCOUNTER — Encounter: Payer: Self-pay | Admitting: "Endocrinology

## 2015-07-21 ENCOUNTER — Ambulatory Visit (INDEPENDENT_AMBULATORY_CARE_PROVIDER_SITE_OTHER): Payer: BLUE CROSS/BLUE SHIELD | Admitting: "Endocrinology

## 2015-07-21 VITALS — BP 157/74 | HR 97 | Ht 59.0 in | Wt 110.0 lb

## 2015-07-21 DIAGNOSIS — E042 Nontoxic multinodular goiter: Secondary | ICD-10-CM | POA: Diagnosis not present

## 2015-07-21 DIAGNOSIS — Z91199 Patient's noncompliance with other medical treatment and regimen due to unspecified reason: Secondary | ICD-10-CM

## 2015-07-21 DIAGNOSIS — Z9119 Patient's noncompliance with other medical treatment and regimen: Secondary | ICD-10-CM | POA: Diagnosis not present

## 2015-07-21 DIAGNOSIS — IMO0002 Reserved for concepts with insufficient information to code with codable children: Secondary | ICD-10-CM

## 2015-07-21 DIAGNOSIS — I10 Essential (primary) hypertension: Secondary | ICD-10-CM | POA: Diagnosis not present

## 2015-07-21 DIAGNOSIS — E1065 Type 1 diabetes mellitus with hyperglycemia: Secondary | ICD-10-CM | POA: Diagnosis not present

## 2015-07-21 DIAGNOSIS — E108 Type 1 diabetes mellitus with unspecified complications: Secondary | ICD-10-CM

## 2015-07-21 NOTE — Progress Notes (Signed)
Subjective:    Patient ID: Rebecca Chan, female    DOB: 04-12-1960, PCP Inc [facility]   Past Medical History  Diagnosis Date  . Hypertension   . Diabetes mellitus without complication [facility])    Past Surgical History  Procedure Laterality Date  . Abdominal hysterectomy     Social History   Social History  . Marital Status: Married    Spouse Name: N/A  . Number of Children: N/A  . Years of Education: N/A   Social History Main Topics  . Smoking status: Never Smoker   . Smokeless tobacco: Never Used  . Alcohol Use: None  . Drug Use: None  . Sexual Activity: Not Asked   Other Topics Concern  . None   Social History Narrative   Outpatient Encounter Prescriptions as of 07/21/2015  Medication Sig  . Insulin Glargine (LANTUS SOLOSTAR) 100 UNIT/ML Solostar Pen Inject 22 Units into the skin at bedtime.  . Insulin Lispro (HUMALOG KWIKPEN) 200 UNIT/ML SOPN Inject 8-14 Units into the skin 3 (three) times daily.  Marland Kitchen lisinopril (PRINIVIL,ZESTRIL) 40 MG tablet Take 40 mg by mouth daily.   No facility-administered encounter medications on file as of 07/21/2015.   ALLERGIES: No Known Allergies VACCINATION STATUS:  There is no immunization history on file for this patient.  Diabetes She presents for her follow-up diabetic visit. She has type 1 diabetes mellitus. Onset time: She was diagnosed at approximate age of 83 years. Her disease course has been improving. There are no hypoglycemic associated symptoms. Pertinent negatives for hypoglycemia include no confusion, headaches, pallor or seizures. Associated symptoms include fatigue, polydipsia and polyuria. Pertinent negatives for diabetes include no chest pain and no polyphagia. There are no hypoglycemic complications. Symptoms are improving. There are no diabetic complications. Risk factors for coronary artery disease include dyslipidemia, diabetes mellitus and hypertension. She is compliant with treatment  none of the time. Her weight is increasing steadily. She is following a generally unhealthy diet. When asked about meal planning, she reported none. She has not had a previous visit with a dietitian (She missed several of her appointments with a dietitian.). She never participates in exercise. Home blood sugar record trend: She did not bring her log, her meter shows random blood glucose reading which is very rare and showing significantly above target average at 243. Her overall blood glucose range is >200 mg/dl. An ACE inhibitor/angiotensin II receptor blocker is being taken.  Hypertension This is a chronic problem. The current episode started more than 1 year ago. The problem is uncontrolled. Pertinent negatives include no chest pain, headaches, palpitations or shortness of breath. Risk factors for coronary artery disease include dyslipidemia and diabetes mellitus. Past treatments include ACE inhibitors. Compliance problems include psychosocial issues.      Review of Systems  Constitutional: Positive for fatigue. Negative for fever, chills and unexpected weight change.  HENT: Negative for trouble swallowing and voice change.   Eyes: Negative for visual disturbance.  Respiratory: Negative for cough, shortness of breath and wheezing.   Cardiovascular: Negative for chest pain, palpitations and leg swelling.  Gastrointestinal: Negative for nausea, vomiting and diarrhea.  Endocrine: Positive for polydipsia and polyuria. Negative for cold intolerance, heat intolerance and polyphagia.  Musculoskeletal: Negative for myalgias and arthralgias.  Skin: Negative for color change, pallor, rash and wound.  Neurological: Negative for seizures and headaches.  Psychiatric/Behavioral: Negative for suicidal ideas and confusion.    Objective:    BP 157/74 mmHg  Pulse  97  Ht 4\' 11"  (1.499 m)  Wt 110 lb (49.896 kg)  BMI 22.21 kg/m2  Wt Readings from Last 3 Encounters:  07/21/15 110 lb (49.896 kg)  06/23/15  108 lb (48.988 kg)  06/04/15 99 lb (44.906 kg)    Physical Exam  Constitutional: She is oriented to person, place, and time. She appears well-developed.  HENT:  Head: Normocephalic and atraumatic.  Eyes: EOM are normal.  Neck: Normal range of motion. Neck supple. No tracheal deviation present. No thyromegaly present.  Cardiovascular: Normal rate and regular rhythm.   Pulmonary/Chest: Effort normal and breath sounds normal.  Abdominal: Soft. Bowel sounds are normal. There is no tenderness. There is no guarding.  Musculoskeletal: Normal range of motion. She exhibits no edema.  Neurological: She is alert and oriented to person, place, and time. She has normal reflexes. No cranial nerve deficit. Coordination normal.  Skin: Skin is warm and dry. No rash noted. No erythema. No pallor.  Psychiatric:  Reluctant affect, unconcerned attitude.      CMP ( most recent) CMP     Component Value Date/Time   NA 144 05/27/2015 1551   K 3.8 05/27/2015 1551   CL 105 05/27/2015 1551   CO2 28 05/27/2015 1551   GLUCOSE 272* 05/27/2015 1551   BUN 12 05/27/2015 1551   CREATININE 0.99 05/27/2015 1551   CALCIUM 10.1 05/27/2015 1551   GFRNONAA >60 05/27/2015 1551   GFRAA >60 05/27/2015 1551     Diabetic Labs (most recent): Lab Results  Component Value Date   HGBA1C 12.8* 05/27/2015    Assessment & Plan:   1. Type I diabetes mellitus with complication, uncontrolled ([place]) -Her diabetes is  complicated by noncompliance and nonadherence and patient remains at a high risk for more acute and chronic complications of diabetes which include CAD, CVA, CKD, retinopathy, and neuropathy. These are all discussed in detail with the patient.  Patient came with Better but still inadequate glucose profile, missed her last appointments since August 2016, and  recent A1c of 12.8% did not improve from her last A1c of 13 %.    Recent labs reviewed.  -Unfortunately, patient remains alarmingly noncompliant and  unconcerned. - I have re-counseled the patient on the need to use insulin strictly by the schedule. - Suggestion is made for patient to avoid simple carbohydrates   from their diet including Cakes , Desserts, Ice Cream,  Soda (  diet and regular) , Sweet Tea , Candies,  Chips, Cookies, Artificial Sweeteners,   and "Sugar-free" Products .  This will help patient to have stable blood glucose profile and potentially avoid unintended  Weight gain.  - Patient is advised to stick to a routine mealtimes to eat 3 meals  a day and avoid unnecessary snacks ( to snack only to correct hypoglycemia).  - I have approached patient with the following individualized plan to manage diabetes and patient reluctantly agrees.  Patient presentation is not typical for type 2 nor type 1, but may be LADA or Monogenic Diabetes.  She has normal renal function.  - I advised her to increase Lantus to 22 units qhs, and Humalog to 8 units TIDAC for pre-meal BG readings of 90-150mg /dl, plus patient specific correction dose of rapid acting insulin for unexpected hyperglycemia above 150mg /dl, associated with strict monitoring of BG AC and HS.  -Adjustment parameters for hypo and hyperglycemia were given in a written document to patient. -Patient is encouraged to call clinic for blood glucose levels less than 70 or  above 300 mg /dl. -Once her a1c approaches target, I will study her pancreatic reserve to characterize her diabetes better.  -She is not a candidate for metformin,SGLT2 inhibitors, and incretin therapy . - Patient specific target  for A1c; LDL, HDL, Triglycerides, and  Waist Circumference were discussed in detail.  2) BP/HTN:  uncontrolled. Continue current medications including ACEI/ARB. 3) Lipids/HPL:  Lipid panel unknown for now. She will be considered for low-dose  Statins on subsequent visits.  4)  Weight/Diet:  exercise, and carbohydrates information provided. 5. Personal history of noncompliance with medical  treatment, presenting hazards to health -I have counseled her for better engagement for intensive insulin therapy to minimize her risk of complications.  6) multinodular goiter: -Per her report, she was supposed to have thyroidectomy this morning which was canceled due to severe hyperglycemia. Review of her thyroid studies show that there are more than 1 slowly growing thyroid nodules on bilateral thyroid lobe, benign  fine-needle aspiration in 2016. -I discussed with her the need for better control of diabetes to minimize surgical complications. -She will be assessed for possible surgical clearance in 4 weeks time based on her glycemic response.  7) Chronic Care/Health Maintenance:  -Patient is on ACEI/ARB  medications and encouraged to continue to follow up with Ophthalmology, Podiatrist at least yearly or according to recommendations, and advised to  stay away from smoking. I have recommended yearly flu vaccine and pneumonia vaccination at least every 5 years; moderate intensity exercise for up to 150 minutes weekly; and  sleep for at least 7 hours a day.  - 25 minutes of time was spent on the care of this patient , 50% of which was applied for counseling on diabetes complications and their preventions.  - I advised patient to maintain close follow up with [facility] for primary care needs.  Patient is asked to bring meter and  blood glucose logs during their next visit.   Follow up plan: -Return in about 6 weeks (around 09/01/2015) for diabetes, follow up with pre-visit labs, meter, and logs.  Glade Lloyd, MD Phone: [phone]  Fax: [phone]   07/21/2015, 4:21 PM

## 2015-08-02 ENCOUNTER — Other Ambulatory Visit: Payer: Self-pay | Admitting: "Endocrinology

## 2015-09-01 ENCOUNTER — Ambulatory Visit: Payer: BLUE CROSS/BLUE SHIELD | Admitting: "Endocrinology

## 2015-09-19 LAB — HEMOGLOBIN A1C: HEMOGLOBIN A1C: 11.1

## 2015-10-03 IMAGING — US US THYROID BIOPSY
2 series · 13 of 14 positions shown · non-contrast
Comparison: none

CLINICAL DATA: 53-year-old female with 2 thyroid nodules containing
microcalcifications.

[Series 1: us thyroid biopsy · 0.04mm/px · 1 of 1 slices shown (1 of 2)]
[im 1/1]
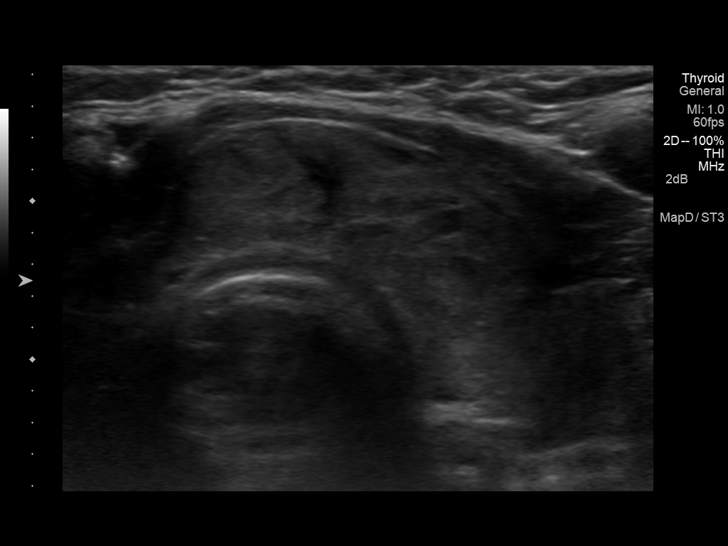

[Series 2: us thyroid biopsy · 0.04mm/px · 13 acquisitions, 12 frames shown (2 of 2)]
[im 1/13]
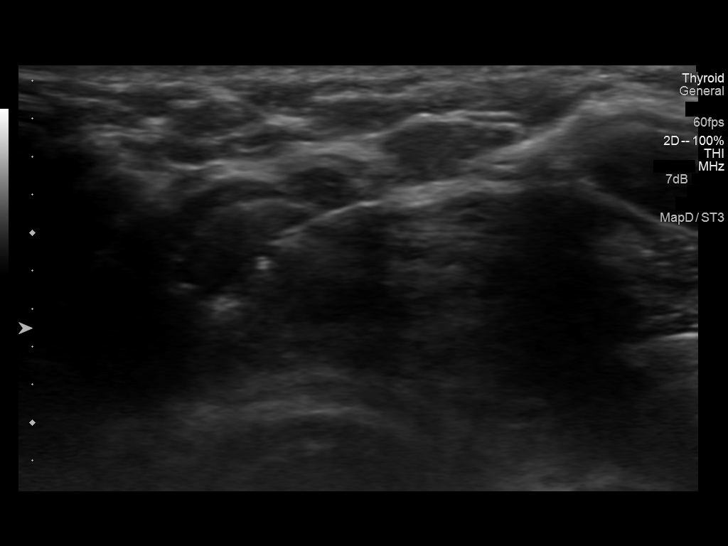
[im 2/13]
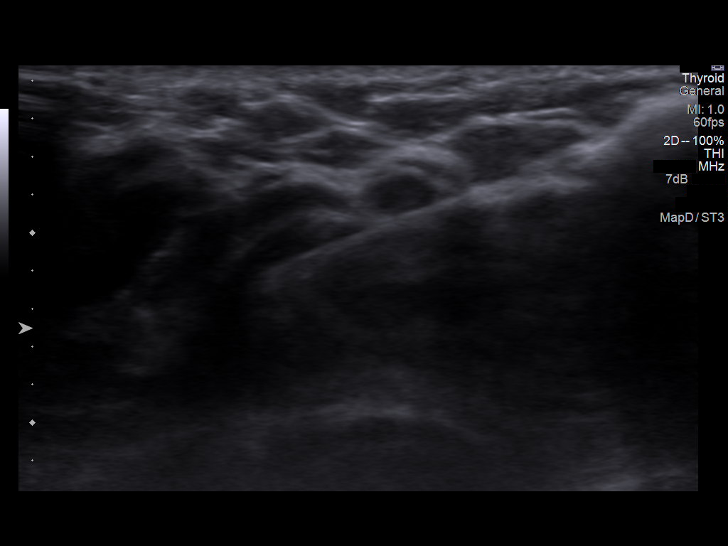
[im 3/13]
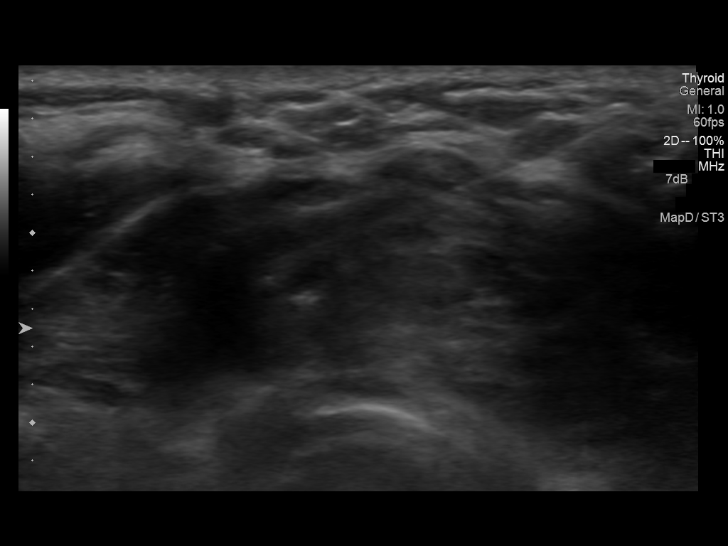
[im 4/13]
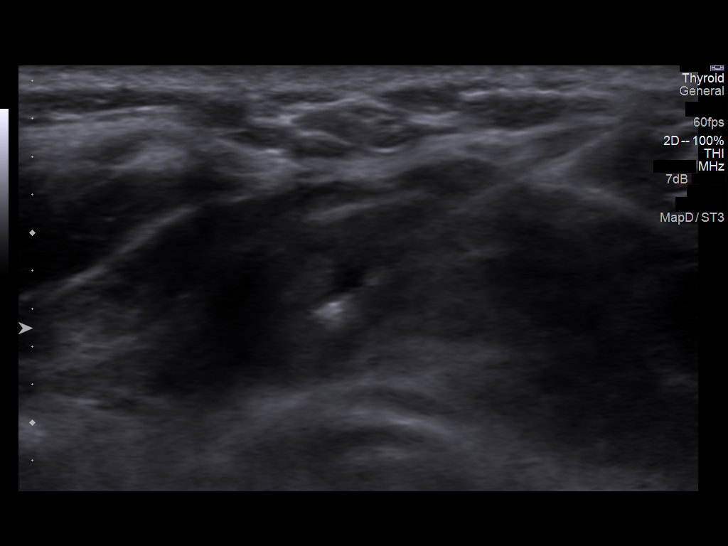
[im 5/13]
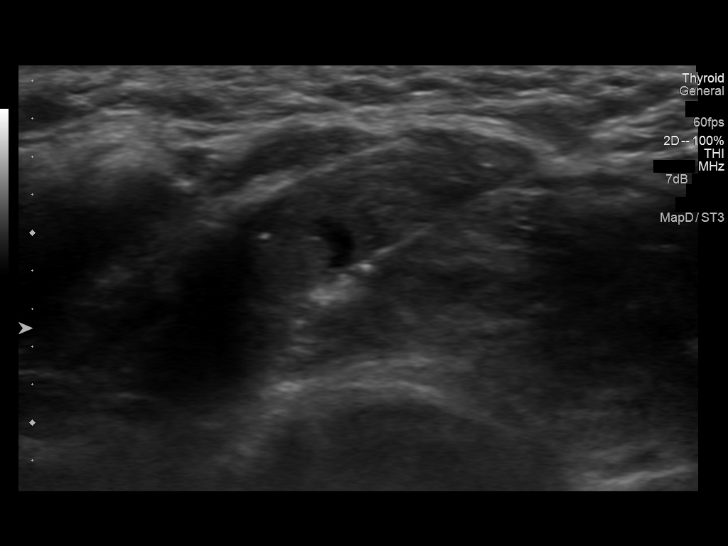
[im 7/13]
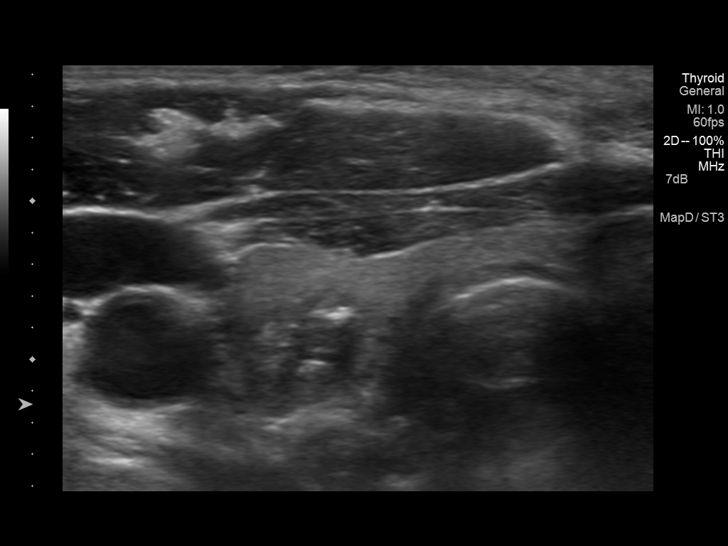
[im 8/13]
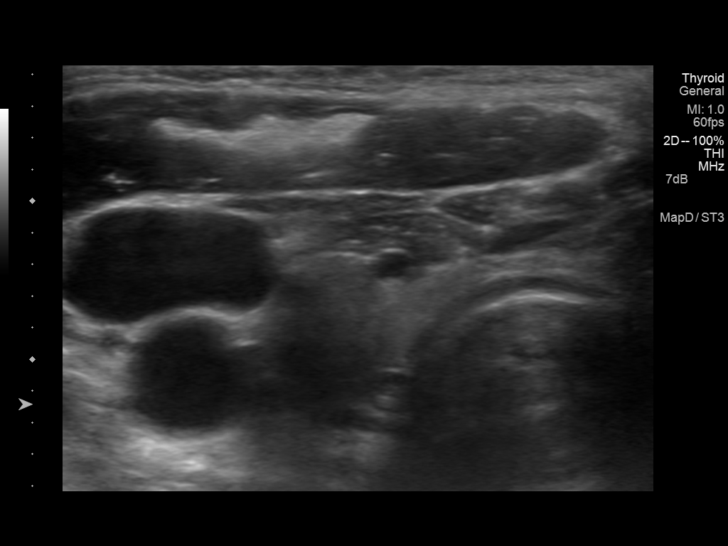
[im 9/13]
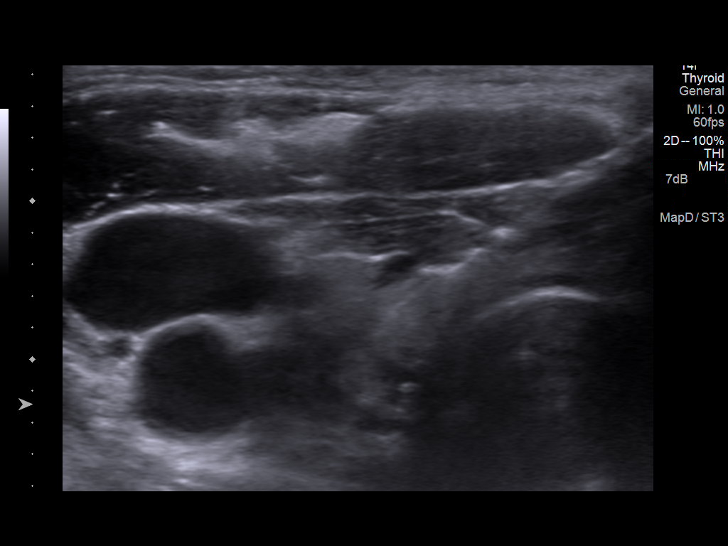
[im 10/13]
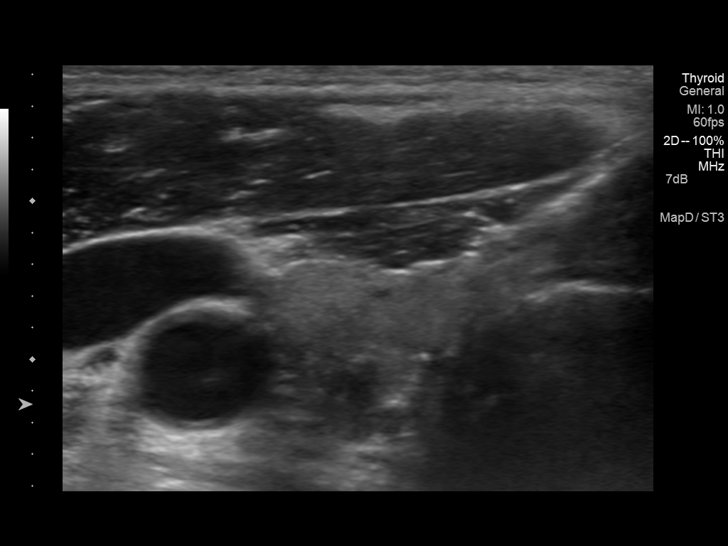
[im 11/13]
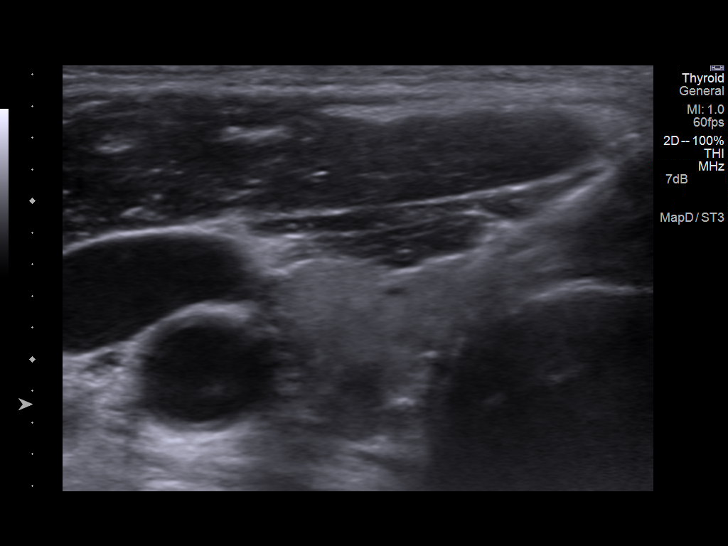
[im 12/13]
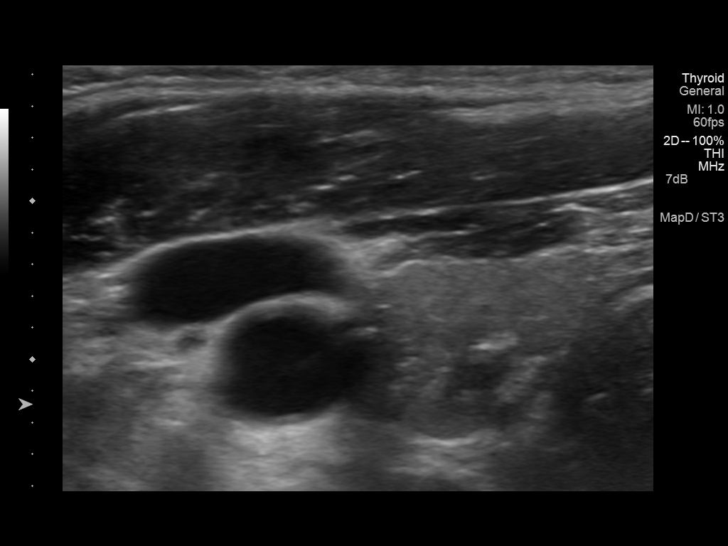
[im 13/13]
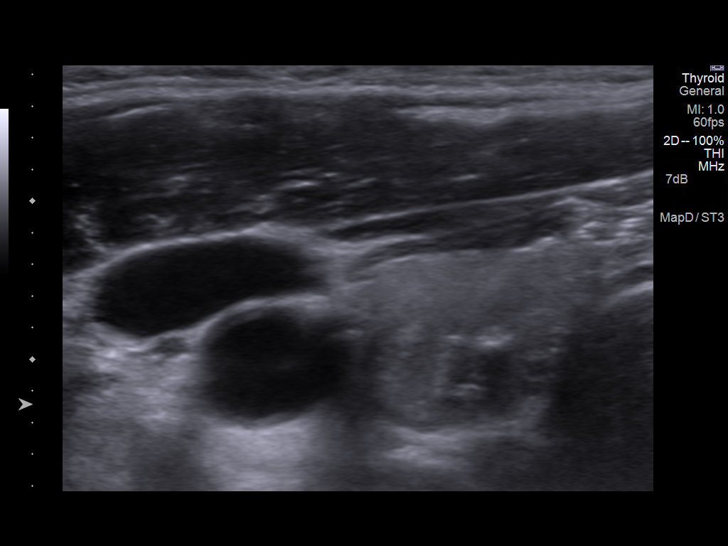

[13 of 14 positions shown; findings below may reference images not displayed]

EXAM:
ULTRASOUND GUIDED NEEDLE ASPIRATE/CORE/NEEDLE ASPIRATE AND CORE
BIOPSY OF THYROID NODULES

MEDICATIONS:
None.

PROCEDURE:
The procedure, risks, benefits, and alternatives were explained to
the patient. Questions regarding the procedure were encouraged and
answered. The patient understands and consents to the procedure.

The operative field was prepped with chlorhexidine in a sterile
fashion, and a sterile drape was applied covering the operative
field. A sterile gown and sterile gloves were used for the
procedure. Local anesthesia was provided with 1% Lidocaine.

The first lesion targeted was the lesion in the left side of the
isthmus of the thyroid gland. 3 fine-needle aspirations through this
lesion with a 25 gauge needle with gentle syringe suction were made,
and specimens were deemed adequate.

The second lesion targeted was the nodule in the right lobe of the
gland, where again 3 fine-needle aspirations through the lesion were
performed with a 25 gauge needle with gentle syringe suction.
Specimens were deemed adequate.

All specimens were carefully marked and sent to the laboratory for
analysis.

COMPLICATIONS:
None.
IMPRESSION: 1. Successful uncomplicated fine-needle aspiration of 2 thyroid
nodules, as above.

## 2015-11-03 ENCOUNTER — Ambulatory Visit: Payer: BLUE CROSS/BLUE SHIELD | Admitting: "Endocrinology

## 2015-11-04 ENCOUNTER — Ambulatory Visit (INDEPENDENT_AMBULATORY_CARE_PROVIDER_SITE_OTHER): Payer: BLUE CROSS/BLUE SHIELD | Admitting: "Endocrinology

## 2015-11-04 ENCOUNTER — Encounter: Payer: Self-pay | Admitting: "Endocrinology

## 2015-11-04 VITALS — BP 147/68 | HR 92 | Ht 59.0 in | Wt 112.0 lb

## 2015-11-04 DIAGNOSIS — I10 Essential (primary) hypertension: Secondary | ICD-10-CM | POA: Diagnosis not present

## 2015-11-04 DIAGNOSIS — IMO0002 Reserved for concepts with insufficient information to code with codable children: Secondary | ICD-10-CM

## 2015-11-04 DIAGNOSIS — E108 Type 1 diabetes mellitus with unspecified complications: Secondary | ICD-10-CM | POA: Diagnosis not present

## 2015-11-04 DIAGNOSIS — E1065 Type 1 diabetes mellitus with hyperglycemia: Secondary | ICD-10-CM | POA: Diagnosis not present

## 2015-11-04 DIAGNOSIS — Z9119 Patient's noncompliance with other medical treatment and regimen: Secondary | ICD-10-CM

## 2015-11-04 DIAGNOSIS — Z91199 Patient's noncompliance with other medical treatment and regimen due to unspecified reason: Secondary | ICD-10-CM

## 2015-11-04 NOTE — Progress Notes (Addendum)
Subjective:    Patient ID: Rebecca Chan, female    DOB: 1960-05-27, PCP Inc [facility]   Past Medical History:  Diagnosis Date  . Diabetes mellitus without complication ([place])   . Hypertension    Past Surgical History:  Procedure Laterality Date  . ABDOMINAL HYSTERECTOMY     Social History   Social History  . Marital status: Married    Spouse name: N/A  . Number of children: N/A  . Years of education: N/A   Social History Main Topics  . Smoking status: Never Smoker  . Smokeless tobacco: Never Used  . Alcohol use None  . Drug use: Unknown  . Sexual activity: Not Asked   Other Topics Concern  . None   Social History Narrative  . None   Outpatient Encounter Prescriptions as of 11/04/2015  Medication Sig  . Insulin Glargine (LANTUS SOLOSTAR) 100 UNIT/ML Solostar Pen Inject 24 Units into the skin at bedtime.  . Insulin Lispro (HUMALOG KWIKPEN) 200 UNIT/ML SOPN Inject 8-14 Units into the skin 3 (three) times daily.  Marland Kitchen lisinopril (PRINIVIL,ZESTRIL) 40 MG tablet TAKE 1 TABLET BY MOUTH EVERY DAY   No facility-administered encounter medications on file as of 11/04/2015.    ALLERGIES: No Known Allergies VACCINATION STATUS:  There is no immunization history on file for this patient.  Diabetes  She presents for her follow-up diabetic visit. She has type 1 diabetes mellitus. Onset time: She was diagnosed at approximate age of 44 years. Her disease course has been stable. There are no hypoglycemic associated symptoms. Pertinent negatives for hypoglycemia include no confusion, headaches, pallor or seizures. Associated symptoms include fatigue, polydipsia and polyuria. Pertinent negatives for diabetes include no chest pain and no polyphagia. There are no hypoglycemic complications. Symptoms are stable. There are no diabetic complications. Risk factors for coronary artery disease include dyslipidemia, diabetes mellitus and hypertension. She is compliant  with treatment none of the time. Her weight is increasing steadily. She is following a generally unhealthy diet. When asked about meal planning, she reported none. She has not had a previous visit with a dietitian (She missed several of her appointments with a dietitian.). She never participates in exercise. Home blood sugar record trend: She did bring her log, her meter shows inadequat   blood glucose monitoring, 33 times in the last 14 days, which would not allow her to use insulin safely. Her breakfast blood glucose range is generally >200 mg/dl. Her lunch blood glucose range is generally >200 mg/dl. Her dinner blood glucose range is generally >200 mg/dl. Her overall blood glucose range is >200 mg/dl. An ACE inhibitor/angiotensin II receptor blocker is being taken.  Hypertension  This is a chronic problem. The current episode started more than 1 year ago. The problem is uncontrolled. Pertinent negatives include no chest pain, headaches, palpitations or shortness of breath. Risk factors for coronary artery disease include dyslipidemia and diabetes mellitus. Past treatments include ACE inhibitors. Compliance problems include psychosocial issues.      Review of Systems  Constitutional: Positive for fatigue. Negative for chills, fever and unexpected weight change.  HENT: Negative for trouble swallowing and voice change.   Eyes: Negative for visual disturbance.  Respiratory: Negative for cough, shortness of breath and wheezing.   Cardiovascular: Negative for chest pain, palpitations and leg swelling.  Gastrointestinal: Negative for diarrhea, nausea and vomiting.  Endocrine: Positive for polydipsia and polyuria. Negative for cold intolerance, heat intolerance and polyphagia.  Musculoskeletal: Negative for arthralgias and  myalgias.  Skin: Negative for color change, pallor, rash and wound.  Neurological: Negative for seizures and headaches.  Psychiatric/Behavioral: Negative for confusion and suicidal  ideas.    Objective:    BP (!) 147/68   Pulse 92   Ht 4\' 11"  (1.499 m)   Wt 112 lb (50.8 kg)   BMI 22.62 kg/m   Wt Readings from Last 3 Encounters:  11/04/15 112 lb (50.8 kg)  07/21/15 110 lb (49.9 kg)  06/23/15 108 lb (49 kg)    Physical Exam  Constitutional: She is oriented to person, place, and time. She appears well-developed.  HENT:  Head: Normocephalic and atraumatic.  Eyes: EOM are normal.  Neck: Normal range of motion. Neck supple. No tracheal deviation present. No thyromegaly present.  Cardiovascular: Normal rate and regular rhythm.   Pulmonary/Chest: Effort normal and breath sounds normal.  Abdominal: Soft. Bowel sounds are normal. There is no tenderness. There is no guarding.  Musculoskeletal: Normal range of motion. She exhibits no edema.  Neurological: She is alert and oriented to person, place, and time. She has normal reflexes. No cranial nerve deficit. Coordination normal.  Skin: Skin is warm and dry. No rash noted. No erythema. No pallor.  Psychiatric:  Reluctant affect, unconcerned attitude.      CMP ( most recent) CMP     Component Value Date/Time   NA 144 05/27/2015 1551   K 3.8 05/27/2015 1551   CL 105 05/27/2015 1551   CO2 28 05/27/2015 1551   GLUCOSE 272 (H) 05/27/2015 1551   BUN 12 05/27/2015 1551   CREATININE 0.99 05/27/2015 1551   CALCIUM 10.1 05/27/2015 1551   GFRNONAA >60 05/27/2015 1551   GFRAA >60 05/27/2015 1551     Diabetic Labs (most recent): Lab Results  Component Value Date   HGBA1C 12.8 (H) 05/27/2015    Assessment & Plan:   1. Type I diabetes mellitus with complication, uncontrolled ([place]) -Her diabetes is  complicated by noncompliance and nonadherence and patient remains at a high risk for more acute and chronic complications of diabetes which include CAD, CVA, CKD, retinopathy, and neuropathy. These are all discussed in detail with the patient.  Patient came with Better but still inadequate glucose profile, missed her  last appointments since August 2016, and  recent A1c of 11.1% , slightly improved from 13%.     Recent labs reviewed.  -Unfortunately, patient remains alarmingly noncompliant and unconcerned. - I have re-counseled the patient on the need to use insulin strictly by the schedule. - Suggestion is made for patient to avoid simple carbohydrates   from their diet including Cakes , Desserts, Ice Cream,  Soda (  diet and regular) , Sweet Tea , Candies,  Chips, Cookies, Artificial Sweeteners,   and "Sugar-free" Products .  This will help patient to have stable blood glucose profile and potentially avoid unintended  Weight gain.  - Patient is advised to stick to a routine mealtimes to eat 3 meals  a day and avoid unnecessary snacks ( to snack only to correct hypoglycemia).  - I have approached patient with the following individualized plan to manage diabetes and patient reluctantly agrees.  Patient presentation is not typical for type 2 nor type 1, but may be LADA or Monogenic Diabetes.  She has normal renal function.  -  she did not use insulin instructions given to her during her last visit rather she used from a prior visit which has low doses of Lantus and Humalog.  - I advised  her to increase Lantus to 24 units qhs, and Humalog to 8 units TIDAC for pre-meal BG readings of 90-150mg /dl, plus patient specific correction dose of rapid acting insulin for unexpected hyperglycemia above 150mg /dl, associated with strict monitoring of BG AC and HS.  -Adjustment parameters for hypo and hyperglycemia were given in a written document to patient. -Patient is encouraged to call clinic for blood glucose levels less than 70 or above 300 mg /dl. -Once her a1c approaches target, I will study her pancreatic reserve to characterize her diabetes better.  -She is not a candidate for metformin,SGLT2 inhibitors, and incretin therapy . - Patient specific target  for A1c; LDL, HDL, Triglycerides, and  Waist Circumference were  discussed in detail.  2) BP/HTN:  uncontrolled. Continue current medications including ACEI/ARB. 3) Lipids/HPL:  Lipid panel unknown for now. She will be considered for low-dose  Statins on subsequent visits.  4)  Weight/Diet:  exercise, and carbohydrates information provided. 5. Personal history of noncompliance with medical treatment, presenting hazards to health -I have counseled her for better engagement for intensive insulin therapy to minimize her risk of complications.  6) multinodular goiter: -Per her report, she was supposed to have thyroidectomy this morning which was canceled due to severe hyperglycemia. Review of her thyroid studies show that there are more than 1 slowly growing thyroid nodules on bilateral thyroid lobe, benign  fine-needle aspiration in 2016. -I discussed with her the need for better control of diabetes to minimize surgical complications.   7) Chronic Care/Health Maintenance:  -Patient is on ACEI/ARB  medications and encouraged to continue to follow up with Ophthalmology, Podiatrist at least yearly or according to recommendations, and advised to  stay away from smoking. I have recommended yearly flu vaccine and pneumonia vaccination at least every 5 years; moderate intensity exercise for up to 150 minutes weekly; and  sleep for at least 7 hours a day.  - 25 minutes of time was spent on the care of this patient , 50% of which was applied for counseling on diabetes complications and their preventions.  - I advised patient to maintain close follow up with [facility] for primary care needs.  Patient is asked to bring meter and  blood glucose logs during their next visit.   Follow up plan: -Return in about 3 months (around 02/03/2016) for follow up with pre-visit labs, meter, and logs.  Glade Lloyd, MD Phone: [phone]  Fax: [phone]   11/04/2015, 3:12 PM

## 2016-02-04 ENCOUNTER — Ambulatory Visit: Payer: BLUE CROSS/BLUE SHIELD | Admitting: "Endocrinology

## 2016-02-07 ENCOUNTER — Other Ambulatory Visit: Payer: Self-pay | Admitting: "Endocrinology

## 2016-08-02 ENCOUNTER — Other Ambulatory Visit: Payer: Self-pay | Admitting: "Endocrinology

## 2016-08-02 IMAGING — US US SOFT TISSUE HEAD/NECK
1 series · 14 of 25 positions shown · non-contrast
Comparison: 02/21/2014

CLINICAL DATA: Follow-up thyroid nodule

EXAM:
THYROID ULTRASOUND
TECHNIQUE: Ultrasound examination of the thyroid gland and adjacent soft
tissues was performed.

[Series 1: us soft tissue head/neck · 0.07mm/px · 14 of 62 slices shown]
[im 1/62]
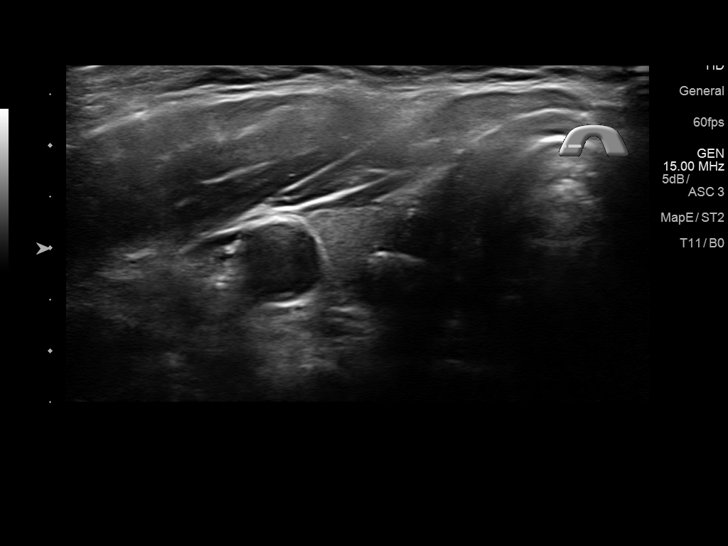
[im 6/62]
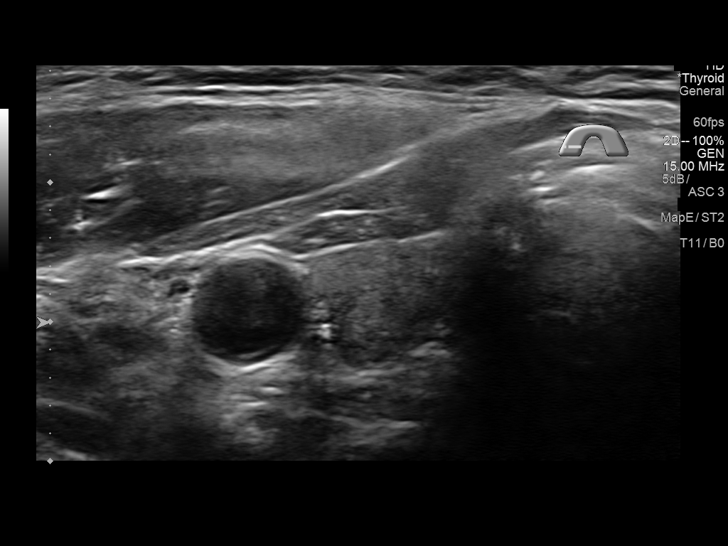
[im 11/62]
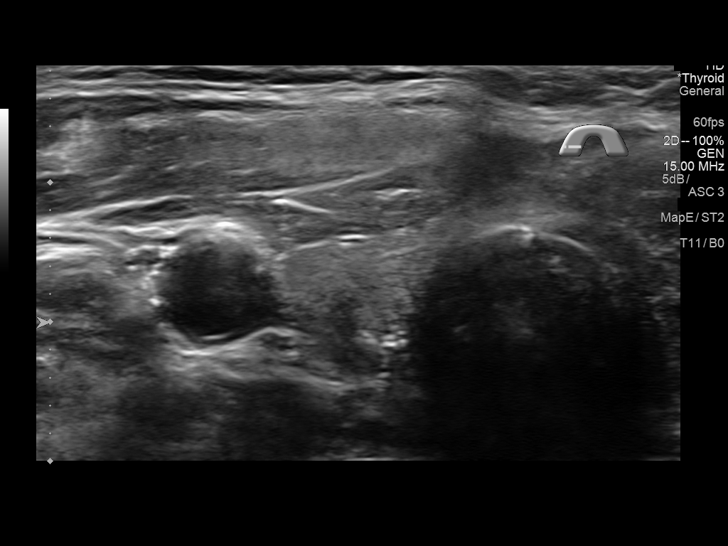
[im 16/62]
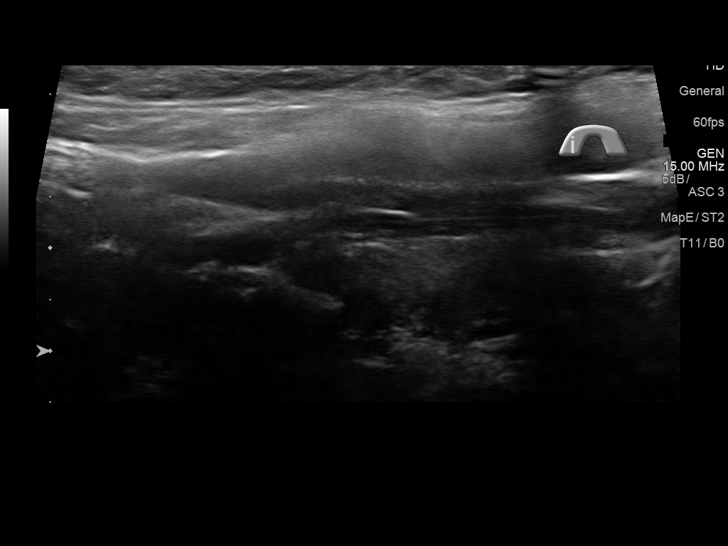
[im 21/62]
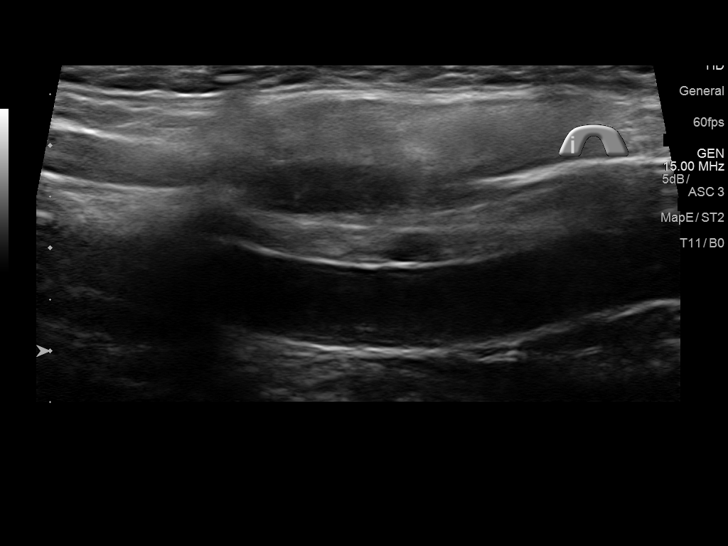
[im 23/62]
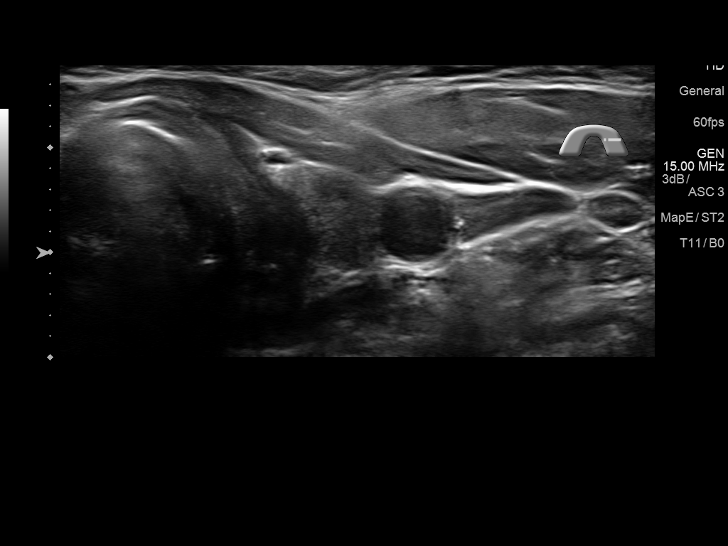
[im 28/62]
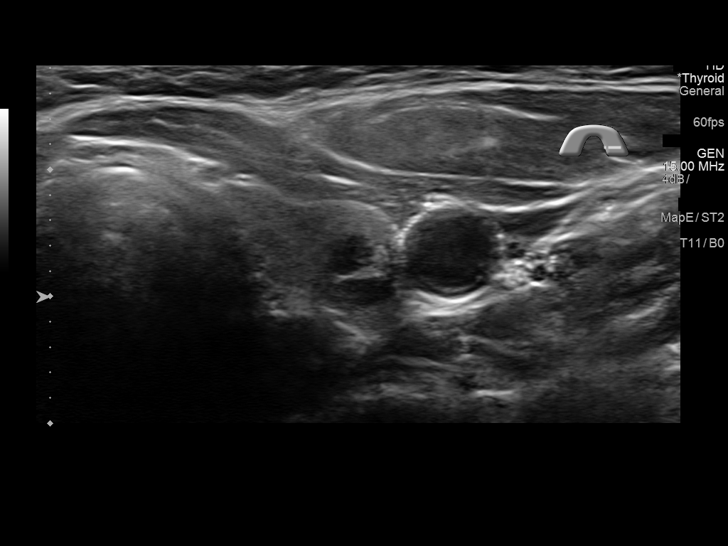
[im 34/62]
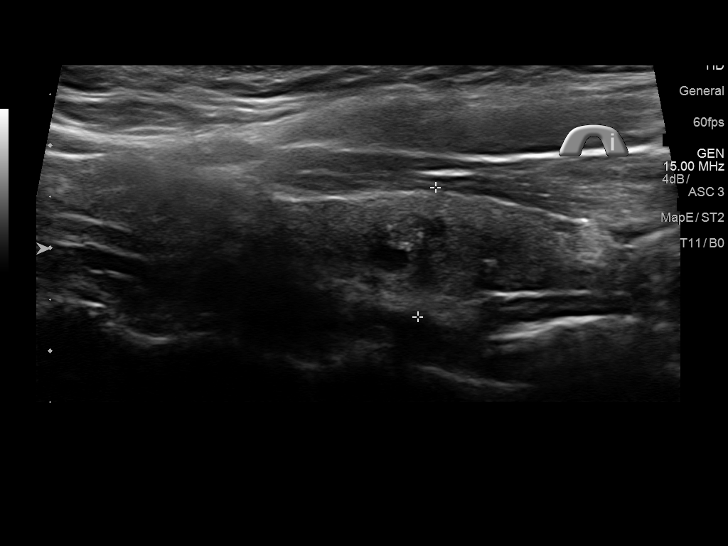
[im 39/62]
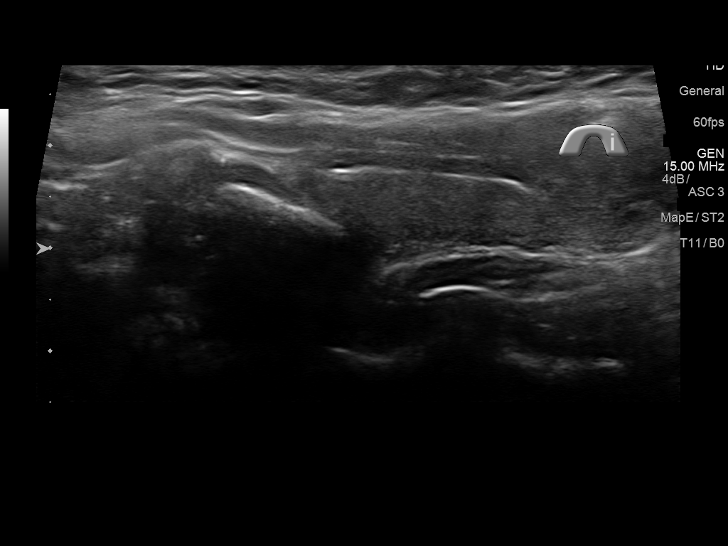
[im 41/62]
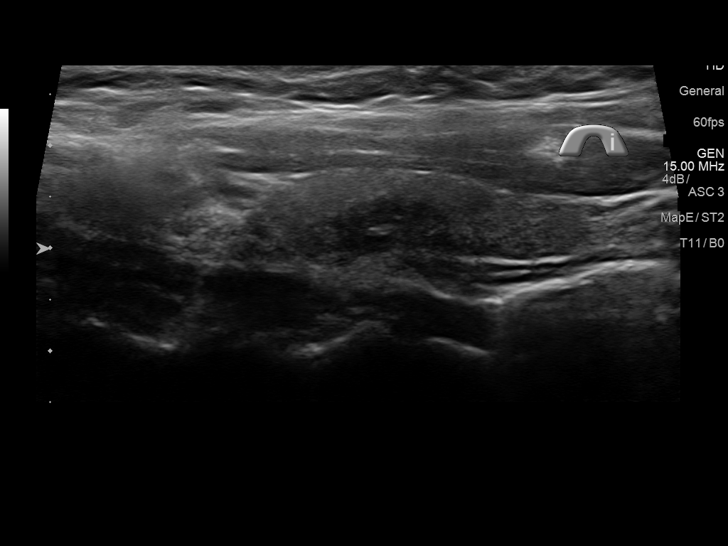
[im 46/62]
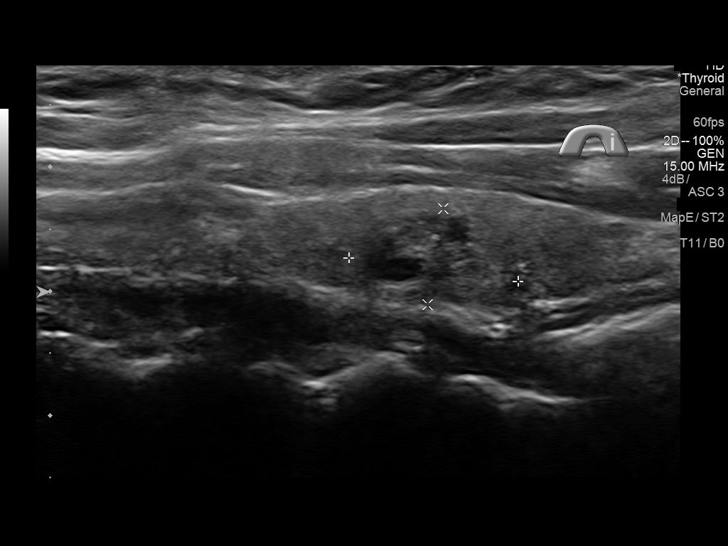
[im 51/62]
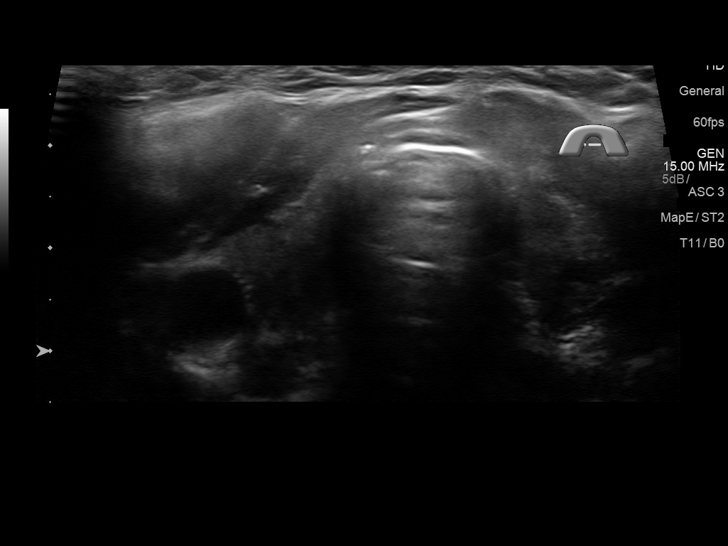
[im 56/62]
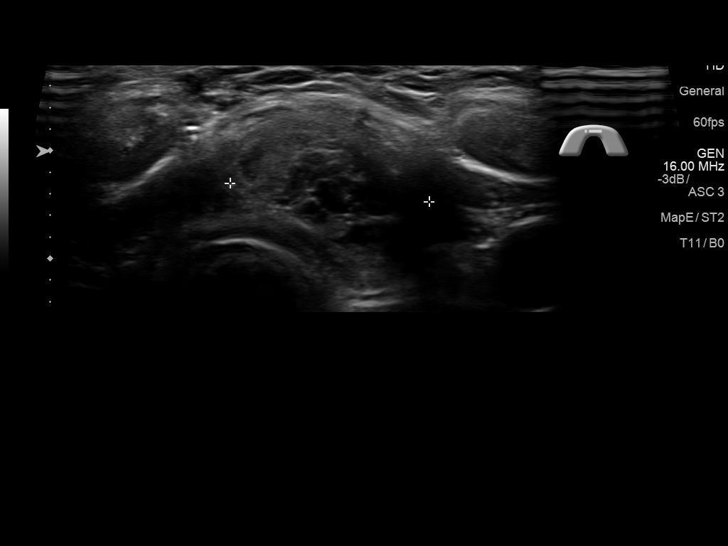
[im 62/62]
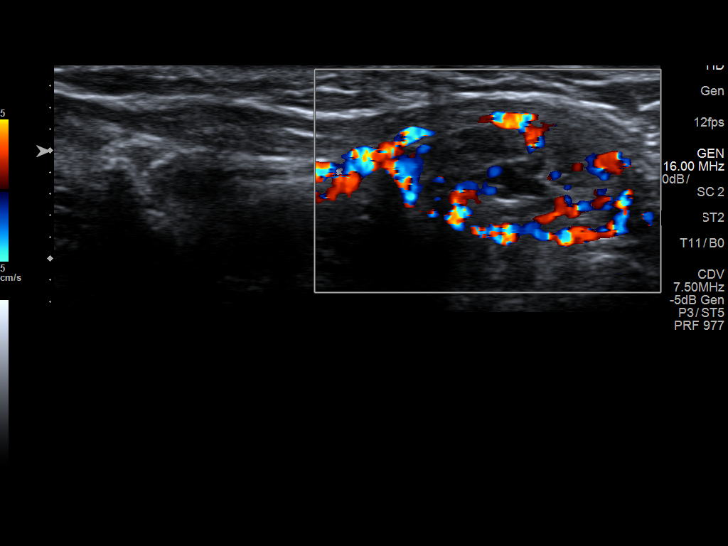

[14 of 25 positions shown; findings below may reference images not displayed]

FINDINGS: Right thyroid lobe

Measurements: 4.1 x 1.3 x 1.1 cm. Right lower pole complex nodule
with calcifications measures 18 x 13 x 10 mm and previously measured
up to 13 mm. It has enlarged.

Left thyroid lobe

Measurements: 3.9 x 1.3 x 1.4 cm. Left lower pole complex nodule
measures 14 x 8 x 7 mm and previously measured up to 16 mm.

Isthmus

Thickness: 1.1 cm. Isthmic nodule today measures 28 x 13 x 19 mm and
previously measured 23 x 9 x 21 mm. It has also enlarged.

Lymphadenopathy

None visualized.
IMPRESSION: The isthmic and right lobe nodules have both enlarged. Malignancy is
not excluded. Both of these nodules were biopsied in Thursday February, 2012. Correlation with prior biopsy results is recommended.
